# Patient Record
Sex: Male | Born: 1940
Health system: Southern US, Community
[De-identification: ages and names within clinical notes are randomized; demographics above are authoritative.]

## PROBLEM LIST (undated history)

## (undated) DIAGNOSIS — R011 Cardiac murmur, unspecified: Secondary | ICD-10-CM

## (undated) DIAGNOSIS — F458 Other somatoform disorders: Secondary | ICD-10-CM

## (undated) HISTORY — DX: Cardiac murmur, unspecified: R01.1

## (undated) HISTORY — DX: Other somatoform disorders: F45.8

## (undated) HISTORY — PX: CYSTECTOMY: SUR359

## (undated) HISTORY — PX: COLONOSCOPY: SHX174

## (undated) HISTORY — PX: POLYPECTOMY: SHX149

---

## 2011-10-10 ENCOUNTER — Ambulatory Visit (INDEPENDENT_AMBULATORY_CARE_PROVIDER_SITE_OTHER): Payer: Medicare Other | Admitting: Emergency Medicine

## 2011-10-10 VITALS — BP 124/78 | HR 64 | Temp 98.2°F | Resp 20 | Ht 73.0 in | Wt 230.6 lb

## 2011-10-10 DIAGNOSIS — N400 Enlarged prostate without lower urinary tract symptoms: Secondary | ICD-10-CM

## 2011-10-10 MED ORDER — DUTASTERIDE 0.5 MG PO CAPS: 0.5000 mg | ORAL_CAPSULE | Freq: Every day | ORAL | Status: DC

## 2011-10-10 NOTE — Progress Notes (Signed)
  Subjective:    Patient ID: Eric Ferrell, male    DOB: 11-11-1940, 71 y.o.   MRN: [id number]  Urinary Frequency  This is a new problem. The current episode started more than 1 year ago. The problem occurs every urination. The problem has been gradually worsening. The patient is experiencing no pain. There has been no fever. There is no history of pyelonephritis. Associated symptoms include frequency and urgency. Pertinent negatives include no chills, discharge, flank pain, hematuria, hesitancy, nausea, possible pregnancy, sweats or vomiting. He has tried nothing for the symptoms. There is no history of catheterization, kidney stones, recurrent UTIs, a single kidney, urinary stasis or a urological procedure.      Review of Systems  Constitutional: Negative.  Negative for chills.  HENT: Negative.   Eyes: Negative.   Respiratory: Negative.   Cardiovascular: Negative.   Gastrointestinal: Negative.  Negative for nausea and vomiting.  Genitourinary: Positive for urgency, frequency and enuresis. Negative for dysuria, hesitancy, hematuria and flank pain.  Musculoskeletal: Negative.        Objective:   Physical Exam  Constitutional: He is oriented to person, place, and time. He appears well-developed and well-nourished.  HENT:  Head: Normocephalic and atraumatic.  Eyes: Conjunctivae and EOM are normal. Pupils are equal, round, and reactive to light. No scleral icterus.  Neck: Normal range of motion. Neck supple.  Cardiovascular: Normal rate and regular rhythm.   Pulmonary/Chest: Effort normal.  Abdominal: Soft.  Genitourinary: Prostate is enlarged. Prostate is not tender.  Musculoskeletal: Normal range of motion.  Neurological: He is alert and oriented to person, place, and time.  Skin: Skin is warm and dry.          Assessment & Plan:

## 2011-10-10 NOTE — Patient Instructions (Signed)
Benign Prostatic Hyperplasia You have an enlarged prostate. This is common in elderly males. It is called BPH. This stands for benign prostate hyperplasia. The prostate gland is located in base of the bladder. When it grows, the prostate blocks the urethra. This is the tube which drains urine from the bladder.  SYMPTOMS  Weak urine stream.   Dribbling.   Feeling like the bladder has not emptied completely.   Difficulty starting urination.   Getting up frequently at night to urinate.   Urinating more frequently during the day.  Complete urinary blockage or severe pain with urination requires immediate attention. DIAGNOSIS   Your caregiver often has a good idea what is wrong by taking a history and doing a physical exam.   Special x-rays may be done.  TREATMENT   For mild problems, no treatment may be necessary.   If the problems are moderate, medications may provide relief. Some of these work by making the prostate gland smaller. The herb saw palmetto is commonly used.   If complete blockage occurs, a Foley catheter is usually left in place for a few days.   Surgery is often needed for more severe problems. TURP is the prostate surgery for BPH which is done through the urethra. TURP stands for transurethral resection of the prostate. It involves cutting away chips from the prostate. It is done by removing chips so that they can come out through the penis.   Techniques using heat, microwave and laser to remove the prostate blockage are also being used.  HOME CARE INSTRUCTIONS   Give yourself time when you urinate.   Stay away from alcohol.   Beverages containing caffeine such as coffee, tea and colas can make the problems worse.   Decongestants, antihistamines, and some prescription medicines can also make the problem worse.   Follow up with your caregiver for further treatment as recommended.  SEEK IMMEDIATE MEDICAL CARE IF:   You develop increased pain with urination or  are unable to pass your water.   You develop severe abdominal pain, vomiting, a high fever, or fainting.   You develop back pain or blood in your urine.  MAKE SURE YOU:   Understand these instructions.   Will watch your condition.   Will get help right away if you are not doing well or get worse.  Document Released: 03/25/2005 Document Revised: 03/14/2011 Document Reviewed: 11/28/2006 Exit[employer] Patient Information 2012 [employer]. 

## 2012-03-24 ENCOUNTER — Other Ambulatory Visit: Payer: Self-pay | Admitting: Emergency Medicine

## 2013-01-21 DIAGNOSIS — Z0271 Encounter for disability determination: Secondary | ICD-10-CM

## 2013-04-11 ENCOUNTER — Ambulatory Visit (INDEPENDENT_AMBULATORY_CARE_PROVIDER_SITE_OTHER): Payer: 59 | Admitting: Family Medicine

## 2013-04-11 VITALS — BP 140/76 | HR 65 | Temp 98.6°F | Resp 16 | Ht 73.0 in | Wt 220.0 lb

## 2013-04-11 DIAGNOSIS — Z0000 Encounter for general adult medical examination without abnormal findings: Secondary | ICD-10-CM

## 2013-04-11 DIAGNOSIS — R9431 Abnormal electrocardiogram [ECG] [EKG]: Secondary | ICD-10-CM

## 2013-04-11 DIAGNOSIS — R011 Cardiac murmur, unspecified: Secondary | ICD-10-CM

## 2013-04-11 DIAGNOSIS — L259 Unspecified contact dermatitis, unspecified cause: Secondary | ICD-10-CM

## 2013-04-11 DIAGNOSIS — Z23 Encounter for immunization: Secondary | ICD-10-CM

## 2013-04-11 DIAGNOSIS — Z139 Encounter for screening, unspecified: Secondary | ICD-10-CM

## 2013-04-11 DIAGNOSIS — L309 Dermatitis, unspecified: Secondary | ICD-10-CM

## 2013-04-11 LAB — TSH: TSH: 1.157 u[IU]/mL (ref 0.350–4.500)

## 2013-04-11 LAB — COMPREHENSIVE METABOLIC PANEL
ALT: 17 U/L (ref 0–53)
AST: 19 U/L (ref 0–37)
Albumin: 4.5 g/dL (ref 3.5–5.2)
Alkaline Phosphatase: 48 U/L (ref 39–117)
BILIRUBIN TOTAL: 0.5 mg/dL (ref 0.3–1.2)
BUN: 21 mg/dL (ref 6–23)
CO2: 24 mEq/L (ref 19–32)
CREATININE: 1.01 mg/dL (ref 0.50–1.35)
Calcium: 9.7 mg/dL (ref 8.4–10.5)
Chloride: 103 mEq/L (ref 96–112)
Glucose, Bld: 87 mg/dL (ref 70–99)
Potassium: 4.1 mEq/L (ref 3.5–5.3)
SODIUM: 138 meq/L (ref 135–145)
TOTAL PROTEIN: 7 g/dL (ref 6.0–8.3)

## 2013-04-11 LAB — POCT CBC
GRANULOCYTE PERCENT: 42.2 % (ref 37–80)
HEMATOCRIT: 47.7 % (ref 43.5–53.7)
HEMOGLOBIN: 14.9 g/dL (ref 14.1–18.1)
LYMPH, POC: 1.6 (ref 0.6–3.4)
MCH, POC: 28.7 pg (ref 27–31.2)
MCHC: 31.2 g/dL — AB (ref 31.8–35.4)
MCV: 91.9 fL (ref 80–97)
MID (cbc): 0.3 (ref 0–0.9)
MPV: 8.6 fL (ref 0–99.8)
POC GRANULOCYTE: 1.4 — AB (ref 2–6.9)
POC LYMPH %: 49.2 % (ref 10–50)
POC MID %: 8.6 % (ref 0–12)
Platelet Count, POC: 264 10*3/uL (ref 142–424)
RBC: 5.19 M/uL (ref 4.69–6.13)
RDW, POC: 13.3 %
WBC: 3.3 10*3/uL — AB (ref 4.6–10.2)

## 2013-04-11 LAB — LIPID PANEL
CHOL/HDL RATIO: 4 ratio
Cholesterol: 176 mg/dL (ref 0–200)
HDL: 44 mg/dL (ref >39)
LDL CALC: 115 mg/dL — AB (ref 0–99)
TRIGLYCERIDES: 86 mg/dL (ref <150)
VLDL: 17 mg/dL (ref 0–40)

## 2013-04-11 LAB — PSA: PSA: 0.64 ng/mL (ref <=4.00)

## 2013-04-11 LAB — IFOBT (OCCULT BLOOD): IMMUNOLOGICAL FECAL OCCULT BLOOD TEST: NEGATIVE

## 2013-04-11 MED ORDER — TRIAMCINOLONE ACETONIDE 0.1 % EX CREA: 1.0000 "application " | TOPICAL_CREAM | Freq: Two times a day (BID) | CUTANEOUS | Status: DC

## 2013-04-11 MED ORDER — ZOSTER VACCINE LIVE 19400 UNT/0.65ML ~~LOC~~ SOLR: 0.6500 mL | Freq: Once | SUBCUTANEOUS | Status: DC

## 2013-04-11 NOTE — Progress Notes (Addendum)
Subjective:    Patient ID: Eric Ferrell, male    DOB: 1940/05/13, 73 y.o.   MRN: [id number]  HPI This chart was scribed for Corliss Parish, MD by Thea Alken, ED Scribe. This patient was seen in room 10 and the patient's care was started at 8:30 AM.    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 Pt has not had any food or drink prior to being seen.  He reports he has had blood work done about 2 month ago at insurance co.  Pt has H/O possible  psoriasis and states he has patches on his bilateral ankles in which he uses lotion with mild relief. Pt states he has never had colonoscopy. Pt reports he used to receive a prostate screening every year at [facility] but has not had one in a couple of years.  Pt states that his dog might have fleas. Denies family history of cancer.  Pt has history of urinary frequency. Pt urinates 1-2 times per night. Pt takes 2 aspirin a day.  Pt has not had a flu shot this year. He reports he has gotten the flu shot before and developed the flu-like symptoms. Pt denies receiving pneumonia vaccine, shingles vaccine. Pt states he has seen dentist one month ago and. Has not seen eye doctor in 3 years.  PT states he regularly exercises -at least 4 times per week.  He denies CP, light headeness, and dizziness. Pt states his mother has had stroke on right side in her early 45's. He denies early heart disease within family.   Pt states that he was seen a couple years ago by Dr. Carlota Raspberry for arm  injury at the gym.  Pt was last seen July 2013 for possible BPH., but not on meds. No difficulty urinating.  Outside lab June 2013 notable LDL cholesterol 106,  total cholesterol 187  No Known Allergies Prior to Admission medications   Medication Sig Start Date End Date Taking? Authorizing Provider  aspirin 81 MG tablet Take 81 mg by mouth 2 (two) times daily.   Yes Historical Provider,  MD  AVODART 0.5 MG capsule TAKE ONE CAPSULE BY MOUTH DAILY 03/24/12   Rise Mu, PA-C   History   Social History  . Marital Status: Unknown    Spouse Name: N/A    Number of Children: N/A  . Years of Education: N/A   Occupational History  . Not on file.   Social History Main Topics  . Smoking status: Former Smoker -- 5 years    Types: Cigarettes  . Smokeless tobacco: Not on file  . Alcohol Use: Not on file  . Drug Use: Not on file  . Sexual Activity: Not on file   Other Topics Concern  . Not on file   Social History Narrative  . No narrative on file      Review of Systems  Respiratory: Negative for chest tightness.   Neurological: Negative for dizziness and light-headedness.  13 point review of systems per patient health survey noted.  Negative other than as indicated on reviewed nursing note.      Objective:   Physical Exam  Vitals reviewed. Constitutional: He is oriented to person, place, and time. He appears well-developed and well-nourished.  HENT:  Head: Normocephalic and atraumatic.  Right Ear: External ear normal.  Left Ear: External ear normal.  Mouth/Throat: Oropharynx is clear  and moist.  Eyes: Conjunctivae and EOM are normal. Pupils are equal, round, and reactive to light.  Neck: Normal range of motion. Neck supple. No thyromegaly present.  Cardiovascular: Normal rate, regular rhythm and intact distal pulses.   Murmur (faint 2/6 systolic mm. ) heard. Pulmonary/Chest: Effort normal and breath sounds normal. No respiratory distress. He has no wheezes.  Abdominal: Soft. He exhibits no distension. There is no tenderness. Hernia confirmed negative in the right inguinal area and confirmed negative in the left inguinal area.  Genitourinary: Prostate normal.  Musculoskeletal: Normal range of motion. He exhibits no edema and no tenderness.  Lymphadenopathy:    He has no cervical adenopathy.  Neurological: He is alert and oriented to person, place, and time.  He has normal reflexes.  Skin: Skin is warm and dry.  Right ankle: slightly dry scaly patch on right lateral ankle. 6x3 cm without warmth or surrounding erythema  Left ankle: similar lesion , patch 2x3 cm without warmth or surrounding erythema  Psychiatric: He has a normal mood and affect. His behavior is normal.   Filed Vitals:   04/11/13 0818  BP: 140/76  Pulse: 65  Temp: 98.6 F (37 C)  TempSrc: Oral  Resp: 16  Height: 6\' 1"  (1.854 m)  Weight: 220 lb (99.791 kg)  SpO2: 99%   Results for orders placed in visit on 04/11/13  POCT CBC      Result Value Range   WBC 3.3 (*) 4.6 - 10.2 K/uL   Lymph, poc 1.6  0.6 - 3.4   POC LYMPH PERCENT 49.2  10 - 50 %L   MID (cbc) 0.3  0 - 0.9   POC MID % 8.6  0 - 12 %M   POC Granulocyte 1.4 (*) 2 - 6.9   Granulocyte percent 42.2  37 - 80 %G   RBC 5.19  4.69 - 6.13 M/uL   Hemoglobin 14.9  14.1 - 18.1 g/dL   HCT, POC 47.7  43.5 - 53.7 %   MCV 91.9  80 - 97 fL   MCH, POC 28.7  27 - 31.2 pg   MCHC 31.2 (*) 31.8 - 35.4 g/dL   RDW, POC 13.3     Platelet Count, POC 264  142 - 424 K/uL   MPV 8.6  0 - 99.8 fL  IFOBT (OCCULT BLOOD)      Result Value Range   IFOBT Negative       EKG: sr, nonspecific twi inferolaterally, large voltage in v1-V6, possible LVH.      Assessment & Plan:   Eric Ferrell is a 73 y.o. male Annual physical exam - Plan: POCT CBC, IFOBT POC (occult bld, rslt in office), Comprehensive metabolic panel, TSH, PSA, Lipid panel, EKG 12-Lead, Ambulatory referral to Gastroenterology, CANCELED: EKG 12-Lead. Anticipatory guidance, labs drawn. Refused flu vaccine, rx given for zostavax, pneumovax given. Declined sti testing.   Eczema - vs psoriasis -  Plan: triamcinolone cream (KENALOG) 0.1 % BID, eucerin bid and recheck if not improving next 4-6 weeks.   Heart murmur, Nonspecific abnormal electrocardiogram (ECG) (EKG) - Plan: Ambulatory referral to Cardiology for eval. Decrease exertional activities, and ER/911 cp precautions  reviewed.   Need for shingles vaccine - Plan: zoster vaccine live, PF, (ZOSTAVAX) 78588 UNT/0.65ML injection rx printed.   Meds ordered this encounter  Medications  . aspirin 81 MG tablet    Sig: Take 81 mg by mouth 2 (two) times daily.  Marland Kitchen triamcinolone cream (KENALOG) 0.1 %    Sig:  Apply 1 application topically 2 (two) times daily.    Dispense:  30 g    Refill:  0  . zoster vaccine live, PF, (ZOSTAVAX) 25956 UNT/0.65ML injection    Sig: Inject 19,400 Units into the skin once.    Dispense:  1 each    Refill:  0   Patient Instructions  Continue Eucerin twice per day and can use steroid cream up to twice per day if needed for possible eczema versus psoriasis on ankles, but if not improving next 4-6 weeks - recheck.  Shingles vaccine can be given at your pharmacy.  You should receive a call or letter about your lab results within the next week to 10 days.  Your EKG was not normal and will need to see a cardiologist to discuss this. We will refer you, but in the meantime, avoid exertional activities until evaluated by cardiologist. Return to the clinic or go to the nearest emergency room if any of your symptoms worsen or new symptoms occur.     Keeping you healthy  Get these tests  Blood pressure- Have your blood pressure checked once a year by your healthcare provider.  Normal blood pressure is 120/80  Weight- Have your body mass index (BMI) calculated to screen for obesity.  BMI is a measure of body fat based on height and weight. You can also calculate your own BMI at [email or web address].  Cholesterol- Have your cholesterol checked every year.  Diabetes- Have your blood sugar checked regularly if you have high blood pressure, high cholesterol, have a family history of diabetes or if you are overweight.  Screening for Colon Cancer- Colonoscopy starting at age 16.  Screening may begin sooner depending on your family history and other health conditions. Follow up colonoscopy  as directed by your Gastroenterologist.  Screening for Prostate Cancer- Both blood work (PSA) and a rectal exam help screen for Prostate Cancer.  Screening begins at age 24 with African-American men and at age 10 with Caucasian men.  Screening may begin sooner depending on your family history.  Take these medicines  Aspirin- One aspirin daily can help prevent Heart disease and Stroke.  Flu shot- Every fall.  Tetanus- Every 10 years.  Zostavax- Once after the age of 104 to prevent Shingles.  Pneumonia shot- Once after the age of 41; if you are younger than 16, ask your healthcare provider if you need a Pneumonia shot.  Take these steps  Don't smoke- If you do smoke, talk to your doctor about quitting.  For tips on how to quit, go to www.smokefree.gov or call 1-800-QUIT-NOW.  Be physically active- Exercise 5 days a week for at least 30 minutes.  If you are not already physically active start slow and gradually work up to 30 minutes of moderate physical activity.  Examples of moderate activity include walking briskly, mowing the yard, dancing, swimming, bicycling, etc.  Eat a healthy diet- Eat a variety of healthy food such as fruits, vegetables, low fat milk, low fat cheese, yogurt, lean meant, poultry, fish, beans, tofu, etc. For more information go to www.thenutritionsource.org  Drink alcohol in moderation- Limit alcohol intake to less than two drinks a day. Never drink and drive.  Dentist- Brush and floss twice daily; visit your dentist twice a year.  Depression- Your emotional health is as important as your physical health. If you're feeling down, or losing interest in things you would normally enjoy please talk to your healthcare provider.  Eye exam- Visit your eye doctor every  year.  Safe sex- If you may be exposed to a sexually transmitted infection, use a condom.  Seat belts- Seat belts can save your life; always wear one.  Smoke/Carbon Monoxide detectors- These detectors  need to be installed on the appropriate level of your home.  Replace batteries at least once a year.  Skin cancer- When out in the sun, cover up and use sunscreen 15 SPF or higher.  Violence- If anyone is threatening you, please tell your healthcare provider.  Living Will/ Health care power of attorney- Speak with your healthcare provider and family.    I personally performed the services described in this documentation, which was scribed in my presence. The recorded information has been reviewed and considered, and addended by me as needed.

## 2013-04-11 NOTE — Patient Instructions (Addendum)
Continue Eucerin twice per day and can use steroid cream up to twice per day if needed for possible eczema versus psoriasis on ankles, but if not improving next 4-6 weeks - recheck.  Shingles vaccine can be given at your pharmacy.  You should receive a call or letter about your lab results within the next week to 10 days.  Your EKG was not normal and will need to see a cardiologist to discuss this. We will refer you, but in the meantime, avoid exertional activities until evaluated by cardiologist. Return to the clinic or go to the nearest emergency room if any of your symptoms worsen or new symptoms occur.     Keeping you healthy  Get these tests  Blood pressure- Have your blood pressure checked once a year by your healthcare provider.  Normal blood pressure is 120/80  Weight- Have your body mass index (BMI) calculated to screen for obesity.  BMI is a measure of body fat based on height and weight. You can also calculate your own BMI at [email or web address].  Cholesterol- Have your cholesterol checked every year.  Diabetes- Have your blood sugar checked regularly if you have high blood pressure, high cholesterol, have a family history of diabetes or if you are overweight.  Screening for Colon Cancer- Colonoscopy starting at age 91.  Screening may begin sooner depending on your family history and other health conditions. Follow up colonoscopy as directed by your Gastroenterologist.  Screening for Prostate Cancer- Both blood work (PSA) and a rectal exam help screen for Prostate Cancer.  Screening begins at age 91 with African-American men and at age 2 with Caucasian men.  Screening may begin sooner depending on your family history.  Take these medicines  Aspirin- One aspirin daily can help prevent Heart disease and Stroke.  Flu shot- Every fall.  Tetanus- Every 10 years.  Zostavax- Once after the age of 54 to prevent Shingles.  Pneumonia shot- Once after the age of 48; if you are  younger than 58, ask your healthcare provider if you need a Pneumonia shot.  Take these steps  Don't smoke- If you do smoke, talk to your doctor about quitting.  For tips on how to quit, go to www.smokefree.gov or call 1-800-QUIT-NOW.  Be physically active- Exercise 5 days a week for at least 30 minutes.  If you are not already physically active start slow and gradually work up to 30 minutes of moderate physical activity.  Examples of moderate activity include walking briskly, mowing the yard, dancing, swimming, bicycling, etc.  Eat a healthy diet- Eat a variety of healthy food such as fruits, vegetables, low fat milk, low fat cheese, yogurt, lean meant, poultry, fish, beans, tofu, etc. For more information go to www.thenutritionsource.org  Drink alcohol in moderation- Limit alcohol intake to less than two drinks a day. Never drink and drive.  Dentist- Brush and floss twice daily; visit your dentist twice a year.  Depression- Your emotional health is as important as your physical health. If you're feeling down, or losing interest in things you would normally enjoy please talk to your healthcare provider.  Eye exam- Visit your eye doctor every year.  Safe sex- If you may be exposed to a sexually transmitted infection, use a condom.  Seat belts- Seat belts can save your life; always wear one.  Smoke/Carbon Monoxide detectors- These detectors need to be installed on the appropriate level of your home.  Replace batteries at least once a year.  Skin cancer- When out in  the sun, cover up and use sunscreen 15 SPF or higher.  Violence- If anyone is threatening you, please tell your healthcare provider.  Living Will/ Health care power of attorney- Speak with your healthcare provider and family.

## 2013-04-12 ENCOUNTER — Encounter: Payer: Medicare Other | Admitting: Family Medicine

## 2013-04-15 ENCOUNTER — Encounter: Payer: Self-pay | Admitting: Family Medicine

## 2013-04-27 ENCOUNTER — Institutional Professional Consult (permissible substitution): Payer: Self-pay | Admitting: Interventional Cardiology

## 2013-07-05 ENCOUNTER — Encounter: Payer: Self-pay | Admitting: Family Medicine

## 2013-11-29 ENCOUNTER — Ambulatory Visit (INDEPENDENT_AMBULATORY_CARE_PROVIDER_SITE_OTHER): Payer: Medicare Other | Admitting: Family Medicine

## 2013-11-29 VITALS — BP 136/74 | HR 61 | Temp 98.8°F | Resp 16 | Ht 73.0 in | Wt 232.6 lb

## 2013-11-29 DIAGNOSIS — T169XXA Foreign body in ear, unspecified ear, initial encounter: Secondary | ICD-10-CM

## 2013-11-29 DIAGNOSIS — T161XXA Foreign body in right ear, initial encounter: Secondary | ICD-10-CM

## 2013-11-29 DIAGNOSIS — IMO0002 Reserved for concepts with insufficient information to code with codable children: Secondary | ICD-10-CM

## 2013-11-29 NOTE — Progress Notes (Signed)
73 year old retired man who for 2 weeks is felt something crawling around his right year. He's tried shaking his head and using Q-tips but nothing has worked. He's having no hearing loss or dizziness or tinnitus  Objective: Regular canals clear and the TM looks normal.  1 cc of Xylocaine 1% plain was instilled in the or in case there was a cockroach or some other insect. He was allowed to lie on his side for 15 minutes.  Noted at conclusion of visit   Assessment: Possible foreign body in the right ear with complete resolution by the time discharge.  Signed, Robyn Haber

## 2014-04-04 ENCOUNTER — Encounter: Payer: Self-pay | Admitting: Internal Medicine

## 2014-05-11 ENCOUNTER — Encounter: Payer: Self-pay | Admitting: Family Medicine

## 2014-05-12 NOTE — Telephone Encounter (Signed)
Noted he has colonoscopy scheduled.  Did he want to return to our office for tetanus? We can do that without seeing a provider, but not covered by medicare for preventative purposes, so may be out of pocket cost. If he had this done at time of injury, would be covered. Can discuss this further at physical if needed. Can we also help him schedule physical?  Please clarify with him and help correct what parts of his chart are incorrect. Let me know if I need to complete something. -JG

## 2014-05-18 ENCOUNTER — Ambulatory Visit (AMBULATORY_SURGERY_CENTER): Payer: Self-pay

## 2014-05-18 VITALS — Ht 73.0 in | Wt 215.0 lb

## 2014-05-18 DIAGNOSIS — Z1211 Encounter for screening for malignant neoplasm of colon: Secondary | ICD-10-CM

## 2014-05-18 MED ORDER — MOVIPREP 100 G PO SOLR: 1.0000 | Freq: Once | ORAL | Status: DC

## 2014-05-18 NOTE — Progress Notes (Signed)
No allergies to eggs or soy No diet/weight loss meds No home oxygen No past problems with anesthesia  Has email  Emmi instructions given for colonoscopy 

## 2014-05-29 ENCOUNTER — Emergency Department (HOSPITAL_COMMUNITY)
Admission: EM | Admit: 2014-05-29 | Discharge: 2014-05-29 | Disposition: A | Discharge status: Home / Self Care | Payer: Medicare Other | Attending: Emergency Medicine | Admitting: Emergency Medicine

## 2014-05-29 ENCOUNTER — Encounter (HOSPITAL_COMMUNITY): Payer: Self-pay | Admitting: Physical Medicine and Rehabilitation

## 2014-05-29 DIAGNOSIS — R42 Dizziness and giddiness: Secondary | ICD-10-CM | POA: Diagnosis not present

## 2014-05-29 DIAGNOSIS — R55 Syncope and collapse: Secondary | ICD-10-CM | POA: Insufficient documentation

## 2014-05-29 DIAGNOSIS — Z87891 Personal history of nicotine dependence: Secondary | ICD-10-CM | POA: Diagnosis not present

## 2014-05-29 DIAGNOSIS — Z7982 Long term (current) use of aspirin: Secondary | ICD-10-CM | POA: Insufficient documentation

## 2014-05-29 LAB — URINALYSIS, ROUTINE W REFLEX MICROSCOPIC
BILIRUBIN URINE: NEGATIVE
Glucose, UA: NEGATIVE mg/dL
Hgb urine dipstick: NEGATIVE
Ketones, ur: NEGATIVE mg/dL
Leukocytes, UA: NEGATIVE
NITRITE: NEGATIVE
PH: 5.5 (ref 5.0–8.0)
Protein, ur: NEGATIVE mg/dL
SPECIFIC GRAVITY, URINE: 1.02 (ref 1.005–1.030)
Urobilinogen, UA: 0.2 mg/dL (ref 0.0–1.0)

## 2014-05-29 LAB — COMPREHENSIVE METABOLIC PANEL
ALT: 19 U/L (ref 0–53)
ANION GAP: 4 — AB (ref 5–15)
AST: 21 U/L (ref 0–37)
Albumin: 3.9 g/dL (ref 3.5–5.2)
Alkaline Phosphatase: 46 U/L (ref 39–117)
BUN: 13 mg/dL (ref 6–23)
CO2: 30 mmol/L (ref 19–32)
Calcium: 9.2 mg/dL (ref 8.4–10.5)
Chloride: 105 mmol/L (ref 96–112)
Creatinine, Ser: 1.1 mg/dL (ref 0.50–1.35)
GFR, EST AFRICAN AMERICAN: 75 mL/min — AB (ref >90)
GFR, EST NON AFRICAN AMERICAN: 65 mL/min — AB (ref >90)
Glucose, Bld: 123 mg/dL — ABNORMAL HIGH (ref 70–99)
Potassium: 4.2 mmol/L (ref 3.5–5.1)
Sodium: 139 mmol/L (ref 135–145)
Total Bilirubin: 0.4 mg/dL (ref 0.3–1.2)
Total Protein: 6.6 g/dL (ref 6.0–8.3)

## 2014-05-29 LAB — CBC
HEMATOCRIT: 43.2 % (ref 39.0–52.0)
Hemoglobin: 14.3 g/dL (ref 13.0–17.0)
MCH: 28.6 pg (ref 26.0–34.0)
MCHC: 33.1 g/dL (ref 30.0–36.0)
MCV: 86.4 fL (ref 78.0–100.0)
Platelets: 211 10*3/uL (ref 150–400)
RBC: 5 MIL/uL (ref 4.22–5.81)
RDW: 12.5 % (ref 11.5–15.5)
WBC: 4.2 10*3/uL (ref 4.0–10.5)

## 2014-05-29 LAB — TROPONIN I: Troponin I: <0.03 ng/mL (ref <0.031)

## 2014-05-29 NOTE — ED Provider Notes (Signed)
CSN: [id number]     Arrival date & time 05/29/14  1315 History   First MD Initiated Contact with Patient 05/29/14 1327     Chief Complaint  Patient presents with  . Loss of Consciousness     (Consider location/radiation/quality/duration/timing/severity/associated sxs/prior Treatment) Patient is a 74 y.o. male presenting with syncope. The history is provided by the patient.  Loss of Consciousness Associated symptoms: no chest pain, no confusion, no fever, no headaches, no shortness of breath, no vomiting and no weakness   pt w near syncopal vs very brief syncopal episode at church today, just pta. Pt states had been standing up for awhile, began to feel faint, lightheaded, then warm/hot, flushed, tried to remain standing, but then fainting/fell to ground. Family indicates he went very slowly down to floor. Pt denies injury or pain. No headache. No neck or back pain. Pt denies any current or preceding palpitations or sense of either fast or irregular heartbeat. No chest pain or discomfort. No sob. No abd pain. No nvd. No recent blood loss, rectal bleeding or melena. No dysuria or gu c/o. No cough or uri c/o. No fever or chills. Pt does indicate he started the prep for a routine colonoscopy 2 days ago (colonoscopy is scheduled for this Wednesday).  He indicates he is eating clear liquids, white rice, and cream of wheat, but no regular food.       History reviewed. No pertinent past medical history. Past Surgical History  Procedure Laterality Date  . Cystectomy      left leg   Family History  Problem Relation Age of Onset  . Healthy Mother   . Healthy Father   . Diabetes Sister   . Healthy Brother   . Healthy Maternal Grandmother   . Healthy Maternal Grandfather   . Healthy Paternal Grandmother   . Healthy Paternal Grandfather   . Healthy Brother   . Healthy Sister   . Colon cancer Neg Hx    History  Substance Use Topics  . Smoking status: Former Smoker -- 5 years    Types:  Cigarettes  . Smokeless tobacco: Never Used  . Alcohol Use: No    Review of Systems  Constitutional: Negative for fever and chills.  HENT: Negative for sore throat.   Eyes: Negative for visual disturbance.  Respiratory: Negative for cough and shortness of breath.   Cardiovascular: Positive for syncope. Negative for chest pain.  Gastrointestinal: Negative for vomiting, abdominal pain, diarrhea and blood in stool.  Endocrine: Negative for polyuria.  Genitourinary: Negative for dysuria and flank pain.  Musculoskeletal: Negative for back pain and neck pain.  Skin: Negative for rash.  Neurological: Negative for weakness, numbness and headaches.       No hx syncope  Hematological: Does not bruise/bleed easily.  Psychiatric/Behavioral: Negative for confusion.      Allergies  Review of patient's allergies indicates no known allergies.  Home Medications   Prior to Admission medications   Medication Sig Start Date End Date Taking? Authorizing Provider  aspirin 81 MG tablet Take 81 mg by mouth 2 (two) times daily.    Historical Provider, MD  MOVIPREP 100 G SOLR Take 1 kit (200 g total) by mouth once. 05/18/14   Jerene Bears, MD  zoster vaccine live, PF, (ZOSTAVAX) 93716 UNT/0.65ML injection Inject 19,400 Units into the skin once. 04/11/13   Wendie Agreste, MD   BP 123/69 mmHg  Pulse 68  Temp(Src) 97.9 F (36.6 C) (Oral)  Resp 20  Ht 6' 1"  (1.854 m)  Wt 215 lb (97.523 kg)  BMI 28.37 kg/m2  SpO2 100% Physical Exam  Constitutional: He is oriented to person, place, and time. He appears well-developed and well-nourished. No distress.  HENT:  Mouth/Throat: Oropharynx is clear and moist.  Eyes: Conjunctivae are normal. Pupils are equal, round, and reactive to light. No scleral icterus.  Neck: Neck supple. No tracheal deviation present.  No bruit  Cardiovascular: Normal rate, regular rhythm, normal heart sounds and intact distal pulses.  Exam reveals no gallop and no friction rub.    No murmur heard. Pulmonary/Chest: Effort normal and breath sounds normal. No accessory muscle usage. No respiratory distress.  Abdominal: Soft. Bowel sounds are normal. He exhibits no distension and no mass. There is no tenderness. There is no rebound and no guarding.  No puls mass  Genitourinary:  No cva tenderness  Musculoskeletal: Normal range of motion. He exhibits no edema or tenderness.  CTLS spine, non tender, aligned, no step off. No extremity pain or injury.   Neurological: He is alert and oriented to person, place, and time.  Motor intact bil, stre 5/5. sens grossly intact. Ambulates w steady gait.   Skin: Skin is warm and dry. No rash noted. He is not diaphoretic.  Psychiatric: He has a normal mood and affect.  Nursing note and vitals reviewed.   ED Course  Procedures (including critical care time) Labs Review  Results for orders placed or performed during the hospital encounter of 05/29/14  CBC  Result Value Ref Range   WBC 4.2 4.0 - 10.5 K/uL   RBC 5.00 4.22 - 5.81 MIL/uL   Hemoglobin 14.3 13.0 - 17.0 g/dL   HCT 43.2 39.0 - 52.0 %   MCV 86.4 78.0 - 100.0 fL   MCH 28.6 26.0 - 34.0 pg   MCHC 33.1 30.0 - 36.0 g/dL   RDW 12.5 11.5 - 15.5 %   Platelets 211 150 - 400 K/uL       EKG Interpretation   Date/Time:  Sunday May 29 2014 13:23:13 EST Ventricular Rate:  66 PR Interval:  195 QRS Duration: 90 QT Interval:  418 QTC Calculation: 438 R Axis:   53 Text Interpretation:  Sinus rhythm Right atrial enlargement Probable LVH  with secondary repol abnrm Non-specific ST-t changes No previous tracing  Confirmed by Ashok Cordia  MD, Lennette Bihari ([id number]) on 05/29/2014 1:37:52 PM      MDM   Iv ns. Labs.   Monitor.  ecg noted. No old ecg to compare.  Pt denies any current or recent cp or discomfort of any sort. No unusual doe or fatigue. No palpitations.  Pt does note recent very minimal po intake, due to bowel prep for colonoscopy this week.  Po fluids.  Reviewed  nursing notes and prior charts for additional history.   1413 labs pending - signed out to Dr Winfred Leeds to check labs, recheck pt.    If labs unremarkable and symptoms remain resolved, feel likely pt will be stable for d/c.      Mirna Mires, MD 05/29/14 1414

## 2014-05-29 NOTE — ED Notes (Signed)
Pt reports that he had a syncopal episode while at church and sts "It's because I'm prepping for a colonoscopy on Wednesday.  Everything I normally eat I can't eat until after Wednesday so I just got hot and haven't been eating enough and passed out.  I'm fine."

## 2014-05-29 NOTE — Discharge Instructions (Signed)
It was our pleasure to provide your ER care today - we hope that you feel better.  If you begin to feel faint/dizzy, as if you may pass out, sit or lay down immediately to avoid fainting and/or injury.  Make sure you are getting adequate fluids, calories, and nutrition, even amidst your bowel prep.  Rest. Drink plenty of fluids.  Follow up with primary care doctor this week.  Return to ER if worse, new symptoms, fevers, chest pain, rapid heart beat, faint, other concern.   Call [facility] tomorrow to let him know about today's event and your visit here. He may want to change your bowel prep Syncope Syncope is a medical term for fainting or passing out. This means you lose consciousness and drop to the ground. People are generally unconscious for less than 5 minutes. You may have some muscle twitches for up to 15 seconds before waking up and returning to normal. Syncope occurs more often in older adults, but it can happen to anyone. While most causes of syncope are not dangerous, syncope can be a sign of a serious medical problem. It is important to seek medical care.  CAUSES  Syncope is caused by a sudden drop in blood flow to the brain. The specific cause is often not determined. Factors that can bring on syncope include:  Taking medicines that lower blood pressure.  Sudden changes in posture, such as standing up quickly.  Taking more medicine than prescribed.  Standing in one place for too long.  Seizure disorders.  Dehydration and excessive exposure to heat.  Low blood sugar (hypoglycemia).  Straining to have a bowel movement.  Heart disease, irregular heartbeat, or other circulatory problems.  Fear, emotional distress, seeing blood, or severe pain. SYMPTOMS  Right before fainting, you may:  Feel dizzy or light-headed.  Feel nauseous.  See all white or all black in your field of vision.  Have cold, clammy skin. DIAGNOSIS  Your health care provider will ask about  your symptoms, perform a physical exam, and perform an electrocardiogram (ECG) to record the electrical activity of your heart. Your health care provider may also perform other heart or blood tests to determine the cause of your syncope which may include:  Transthoracic echocardiogram (TTE). During echocardiography, sound waves are used to evaluate how blood flows through your heart.  Transesophageal echocardiogram (TEE).  Cardiac monitoring. This allows your health care provider to monitor your heart rate and rhythm in real time.  Holter monitor. This is a portable device that records your heartbeat and can help diagnose heart arrhythmias. It allows your health care provider to track your heart activity for several days, if needed.  Stress tests by exercise or by giving medicine that makes the heart beat faster. TREATMENT  In most cases, no treatment is needed. Depending on the cause of your syncope, your health care provider may recommend changing or stopping some of your medicines. HOME CARE INSTRUCTIONS  Have someone stay with you until you feel stable.  Do not drive, use machinery, or play sports until your health care provider says it is okay.  Keep all follow-up appointments as directed by your health care provider.  Lie down right away if you start feeling like you might faint. Breathe deeply and steadily. Wait until all the symptoms have passed.  Drink enough fluids to keep your urine clear or pale yellow.  If you are taking blood pressure or heart medicine, get up slowly and take several minutes to sit and  then stand. This can reduce dizziness. SEEK IMMEDIATE MEDICAL CARE IF:   You have a severe headache.  You have unusual pain in the chest, abdomen, or back.  You are bleeding from your mouth or rectum, or you have black or tarry stool.  You have an irregular or very fast heartbeat.  You have pain with breathing.  You have repeated fainting or seizure-like jerking  during an episode.  You faint when sitting or lying down.  You have confusion.  You have trouble walking.  You have severe weakness.  You have vision problems. If you fainted, call your local emergency services (911 in [place] Do not drive yourself to the hospital.  MAKE SURE YOU:  Understand these instructions.  Will watch your condition.  Will get help right away if you are not doing well or get worse. Document Released: 03/25/2005 Document Revised: 03/30/2013 Document Reviewed: 05/24/2011 [employer] Patient Information 2015 [place], [place]. This information is not intended to replace advice given to you by your health care provider. Make sure you discuss any questions you have with your health care provider.

## 2014-05-29 NOTE — ED Notes (Signed)
Pt presents to department via [facility] for evaluation of syncope. Was at church when he became dizzy and diaphoretic, then passed out. CBG 107. Pt is alert and oriented x4 upon arrival. Denies pain. 20g L hand.

## 2014-05-29 NOTE — ED Provider Notes (Signed)
Shouldn't had syncopal event today while seated at church. States church was extremely hot. He had brief syncopal event. He is presently asymptomatic and feels well. Patient has been undergoing bowel prep since 05/27/2014 for colonoscopy which is scheduled to occur in 3 days. On exam alert no distress lungs clear auscultation heart regular rate and rhythm no murmurs. He is not lightheaded on standing. Gait is normal. He appears well and feels ready to go home. Patient is instructed to call Dr.Ptyrtle tomorrow to let him know about today's event, in preparation for colonoscopy scheduled for 3 days from a Syncope likely vasovagal in etiology  Orlie Dakin, MD 05/29/14 1712

## 2014-05-30 ENCOUNTER — Telehealth: Payer: Self-pay | Admitting: Internal Medicine

## 2014-05-30 NOTE — Telephone Encounter (Signed)
Pt had syncopal episode in church yesterday.  He was told by the ER doctor to let you know since his procedure is Wednesday.   He told me that his diet basically consists of what we told him not to eat starting 5 days before.  He has only been eating cream of wheat and rice.   Spoke with pt and he states, "I am feeling great and have been to the gym today."  He had no further episodes.  I told him to eat a regular diet (besides the no nuts, salads, etc..that we told him at his PV) today and to push fluids t/o his prep.    Dr. Hilarie Fredrickson, Do you have any further recommendations?  Cyril Mourning

## 2014-05-30 NOTE — Telephone Encounter (Signed)
Noted  

## 2014-05-30 NOTE — Telephone Encounter (Signed)
Focus on hydration and call if any other problems At this point felt okay for colonoscopy on Wednesday

## 2014-06-01 ENCOUNTER — Encounter: Payer: Self-pay | Admitting: Internal Medicine

## 2014-06-01 ENCOUNTER — Ambulatory Visit (AMBULATORY_SURGERY_CENTER): Payer: Medicare Other | Admitting: Internal Medicine

## 2014-06-01 VITALS — BP 118/50 | HR 57 | Temp 96.7°F | Resp 27 | Ht 73.0 in | Wt 215.0 lb

## 2014-06-01 DIAGNOSIS — D122 Benign neoplasm of ascending colon: Secondary | ICD-10-CM

## 2014-06-01 DIAGNOSIS — D125 Benign neoplasm of sigmoid colon: Secondary | ICD-10-CM | POA: Diagnosis not present

## 2014-06-01 DIAGNOSIS — D123 Benign neoplasm of transverse colon: Secondary | ICD-10-CM

## 2014-06-01 DIAGNOSIS — D124 Benign neoplasm of descending colon: Secondary | ICD-10-CM

## 2014-06-01 DIAGNOSIS — Z1211 Encounter for screening for malignant neoplasm of colon: Secondary | ICD-10-CM

## 2014-06-01 MED ORDER — SODIUM CHLORIDE 0.9 % IV SOLN: 500.0000 mL | INTRAVENOUS | Status: DC

## 2014-06-01 NOTE — Progress Notes (Signed)
Called to room to assist during endoscopic procedure.  Patient ID and intended procedure confirmed with present staff. Received instructions for my participation in the procedure from the performing physician.  

## 2014-06-01 NOTE — Progress Notes (Signed)
A/ox3 pleased with MAC, report to Wendy RN 

## 2014-06-01 NOTE — Patient Instructions (Signed)
YOU HAD AN ENDOSCOPIC PROCEDURE TODAY AT THE [facility]: Refer to the procedure report that was given to you for any specific questions about what was found during the examination.  If the procedure report does not answer your questions, please call your gastroenterologist to clarify.  If you requested that your care partner not be given the details of your procedure findings, then the procedure report has been included in a sealed envelope for you to review at your convenience later.  YOU SHOULD EXPECT: Some feelings of bloating in the abdomen. Passage of more gas than usual.  Walking can help get rid of the air that was put into your GI tract during the procedure and reduce the bloating. If you had a lower endoscopy (such as a colonoscopy or flexible sigmoidoscopy) you may notice spotting of blood in your stool or on the toilet paper. If you underwent a bowel prep for your procedure, then you may not have a normal bowel movement for a few days.  DIET: Your first meal following the procedure should be a light meal and then it is ok to progress to your normal diet.  A half-sandwich or bowl of soup is an example of a good first meal.  Heavy or fried foods are harder to digest and may make you feel nauseous or bloated.  Likewise meals heavy in dairy and vegetables can cause extra gas to form and this can also increase the bloating.  Drink plenty of fluids but you should avoid alcoholic beverages for 24 hours.  ACTIVITY: Your care partner should take you home directly after the procedure.  You should plan to take it easy, moving slowly for the rest of the day.  You can resume normal activity the day after the procedure however you should NOT DRIVE or use heavy machinery for 24 hours (because of the sedation medicines used during the test).    SYMPTOMS TO REPORT IMMEDIATELY: A gastroenterologist can be reached at any hour.  During normal business hours, 8:30 AM to 5:00 PM Monday through Friday,  call [phone].  After hours and on weekends, please call the GI answering service at [phone] who will take a message and have the physician on call contact you.   Following lower endoscopy (colonoscopy or flexible sigmoidoscopy):  Excessive amounts of blood in the stool  Significant tenderness or worsening of abdominal pains  Swelling of the abdomen that is new, acute  Fever of 100F or higher  FOLLOW UP: If any biopsies were taken you will be contacted by phone or by letter within the next 1-3 weeks.  Call your gastroenterologist if you have not heard about the biopsies in 3 weeks.  Our staff will call the home number listed on your records the next business day following your procedure to check on you and address any questions or concerns that you may have at that time regarding the information given to you following your procedure. This is a courtesy call and so if there is no answer at the home number and we have not heard from you through the emergency physician on call, we will assume that you have returned to your regular daily activities without incident.  SIGNATURES/CONFIDENTIALITY: You and/or your care partner have signed paperwork which will be entered into your electronic medical record.  These signatures attest to the fact that that the information above on your After Visit Summary has been reviewed and is understood.  Full responsibility of the confidentiality of this   discharge information lies with you and/or your care-partner.  Recommendations Next colonoscopy determined by pathology results. Discharge instructions given to patient and/or care partner. Polyp handout provided.

## 2014-06-01 NOTE — Op Note (Signed)
[place]  [address] [place], [id number]   COLONOSCOPY PROCEDURE REPORT  PATIENT: Eric, Ferrell  MR#: [id number] BIRTHDATE: [birth year] , 73  yrs. old GENDER: male ENDOSCOPIST: Jerene Bears, MD REFERRED BY: PROCEDURE DATE:  06/01/2014 PROCEDURE:   Colonoscopy with snare polypectomy and Colonoscopy with cold biopsy polypectomy First Screening Colonoscopy - Avg.  risk and is 50 yrs.  old or older Yes.  Prior Negative Screening - Now for repeat screening. N/A  History of Adenoma - Now for follow-up colonoscopy & has been > or = to 3 yrs.  N/A  Polyps Removed Today? Yes. ASA CLASS:   Class II INDICATIONS:average risk patient for colon cancer and 1st colonoscopy. MEDICATIONS: Monitored anesthesia care and Propofol 240 mg IV  DESCRIPTION OF PROCEDURE:   After the risks benefits and alternatives of the procedure were thoroughly explained, informed consent was obtained.  The digital rectal exam revealed no rectal mass.   The LB [id number]  endoscope was introduced through the anus and advanced to the cecum, which was identified by both the appendix and ileocecal valve. No adverse events experienced. The quality of the prep was good, using MoviPrep  The instrument was then slowly withdrawn as the colon was fully examined.   COLON FINDINGS: Eight sessile polyps ranging from 3 to 74mm in size were found in the transverse colon (4), ascending colon (2), sigmoid colon (1), and descending colon (1).  Polypectomies were performed with a cold snare (5) and with cold forceps (3).  The resection was complete, the polyp tissue was completely retrieved and sent to histology.   The examination was otherwise normal. Retroflexed views revealed no abnormalities. The time to cecum=3 minutes 45 seconds.  Withdrawal time=21 minutes 59 seconds.  The scope was withdrawn and the procedure completed. COMPLICATIONS: There were no immediate complications.  ENDOSCOPIC  IMPRESSION: 1.   Eight sessile polyps ranging from 3 to 35mm in size were found in the transverse colon, ascending colon, sigmoid colon, and descending colon; polypectomies were performed with a cold snare and with cold forceps 2.   The examination was otherwise normal  RECOMMENDATIONS: 1.  Await pathology results 2.  Timing of repeat colonoscopy will be determined by pathology findings. 3.  You will receive a letter within 1-2 weeks with the results of your biopsy as well as final recommendations.  Please call my office if you have not received a letter after 3 weeks.  eSigned:  Jerene Bears, MD 06/01/2014 12:10 PM   cc: [name], MD and The Patient

## 2014-06-02 ENCOUNTER — Telehealth: Payer: Self-pay | Admitting: *Deleted

## 2014-06-02 NOTE — Telephone Encounter (Signed)
  Follow up Call-  Call back number 06/01/2014  Post procedure Call Back phone  # [phone] hm  Permission to leave phone message Yes     Patient questions:  Do you have a fever, pain , or abdominal swelling? No. Pain Score  0 *  Have you tolerated food without any problems? Yes.    Have you been able to return to your normal activities? Yes.    Do you have any questions about your discharge instructions: Diet   No. Medications  No. Follow up visit  No.  Do you have questions or concerns about your Care? No.  Actions: * If pain score is 4 or above: No action needed, pain <4.

## 2014-06-08 ENCOUNTER — Encounter: Payer: Self-pay | Admitting: Internal Medicine

## 2014-08-15 ENCOUNTER — Encounter: Payer: Self-pay | Admitting: Family Medicine

## 2014-08-15 ENCOUNTER — Encounter (INDEPENDENT_AMBULATORY_CARE_PROVIDER_SITE_OTHER): Payer: Medicare Other | Admitting: Family Medicine

## 2014-08-17 NOTE — Progress Notes (Signed)
This encounter was created in error - please disregard.

## 2014-09-23 ENCOUNTER — Encounter: Payer: Self-pay | Admitting: *Deleted

## 2014-10-03 ENCOUNTER — Other Ambulatory Visit: Payer: Self-pay

## 2014-10-03 ENCOUNTER — Telehealth: Payer: Self-pay | Admitting: *Deleted

## 2014-10-03 NOTE — Telephone Encounter (Signed)
Patient phoned in response to my letter to schedule his AWV.  Patient instructed on NPO, etc...  He will probably come in first thing in am to have labs drawn & come back in afternoon..   Patient verbalized understanding and appreciation.

## 2014-11-29 ENCOUNTER — Telehealth: Payer: Self-pay | Admitting: *Deleted

## 2014-11-29 NOTE — Telephone Encounter (Signed)
Phoned patient to reschedule 9/9 OV, no answer, [name] on voicemail.

## 2014-12-01 NOTE — Telephone Encounter (Signed)
Patient returned my call, LMOM, returned his call, and he had already turned phone off (went straight to voicemail, he works nights).  LMTCB

## 2014-12-01 NOTE — Telephone Encounter (Signed)
Pt returned my call and we were finally able to reschedule his CPE to 12/5.  Pt verbalized understanding of fasting for labs and appreciation.

## 2014-12-26 ENCOUNTER — Ambulatory Visit: Payer: Self-pay | Admitting: Family Medicine

## 2015-03-13 ENCOUNTER — Encounter: Payer: Self-pay | Admitting: Family Medicine

## 2015-03-13 ENCOUNTER — Ambulatory Visit (INDEPENDENT_AMBULATORY_CARE_PROVIDER_SITE_OTHER): Payer: Medicare Other | Admitting: Family Medicine

## 2015-03-13 VITALS — BP 120/64 | HR 64 | Temp 98.6°F | Resp 16 | Ht 72.0 in | Wt 193.8 lb

## 2015-03-13 DIAGNOSIS — Z0000 Encounter for general adult medical examination without abnormal findings: Secondary | ICD-10-CM

## 2015-03-13 DIAGNOSIS — Z131 Encounter for screening for diabetes mellitus: Secondary | ICD-10-CM

## 2015-03-13 DIAGNOSIS — Z136 Encounter for screening for cardiovascular disorders: Secondary | ICD-10-CM | POA: Diagnosis not present

## 2015-03-13 DIAGNOSIS — Z13220 Encounter for screening for lipoid disorders: Secondary | ICD-10-CM

## 2015-03-13 DIAGNOSIS — Z23 Encounter for immunization: Secondary | ICD-10-CM | POA: Diagnosis not present

## 2015-03-13 DIAGNOSIS — Z125 Encounter for screening for malignant neoplasm of prostate: Secondary | ICD-10-CM

## 2015-03-13 DIAGNOSIS — Z130 Encounter for screening for diseases of the blood and blood-forming organs and certain disorders involving the immune mechanism: Secondary | ICD-10-CM | POA: Diagnosis not present

## 2015-03-13 LAB — COMPLETE METABOLIC PANEL WITH GFR
ALT: 18 U/L (ref 9–46)
AST: 21 U/L (ref 10–35)
Albumin: 4.5 g/dL (ref 3.6–5.1)
Alkaline Phosphatase: 59 U/L (ref 40–115)
BUN: 19 mg/dL (ref 7–25)
CALCIUM: 9.8 mg/dL (ref 8.6–10.3)
CO2: 31 mmol/L (ref 20–31)
CREATININE: 1.03 mg/dL (ref 0.70–1.18)
Chloride: 102 mmol/L (ref 98–110)
GFR, EST NON AFRICAN AMERICAN: 71 mL/min (ref >=60)
GFR, Est African American: 82 mL/min (ref >=60)
Glucose, Bld: 99 mg/dL (ref 65–99)
POTASSIUM: 4.7 mmol/L (ref 3.5–5.3)
Sodium: 139 mmol/L (ref 135–146)
Total Bilirubin: 0.5 mg/dL (ref 0.2–1.2)
Total Protein: 7.1 g/dL (ref 6.1–8.1)

## 2015-03-13 LAB — CBC
HCT: 43.6 % (ref 39.0–52.0)
Hemoglobin: 15 g/dL (ref 13.0–17.0)
MCH: 30.1 pg (ref 26.0–34.0)
MCHC: 34.4 g/dL (ref 30.0–36.0)
MCV: 87.4 fL (ref 78.0–100.0)
MPV: 9.5 fL (ref 8.6–12.4)
PLATELETS: 278 10*3/uL (ref 150–400)
RBC: 4.99 MIL/uL (ref 4.22–5.81)
RDW: 12.5 % (ref 11.5–15.5)
WBC: 3.2 10*3/uL — ABNORMAL LOW (ref 4.0–10.5)

## 2015-03-13 LAB — LIPID PANEL
CHOL/HDL RATIO: 3.1 ratio (ref <=5.0)
Cholesterol: 162 mg/dL (ref 125–200)
HDL: 52 mg/dL (ref >=40)
LDL Cholesterol: 101 mg/dL (ref <130)
TRIGLYCERIDES: 46 mg/dL (ref <150)
VLDL: 9 mg/dL (ref <30)

## 2015-03-13 NOTE — Progress Notes (Signed)
   Subjective:    Patient ID: Eric Ferrell., male    DOB: 1941/01/09, 74 y.o.   MRN: [id number]  HPI    Review of Systems  Constitutional: Negative.   HENT: Negative.   Eyes: Negative.   Respiratory: Negative.   Cardiovascular: Negative.   Gastrointestinal: Negative.   Endocrine: Negative.   Genitourinary: Negative.   Musculoskeletal: Negative.   Skin: Negative.   Allergic/Immunologic: Negative.   Neurological: Negative.   Hematological: Negative.   Psychiatric/Behavioral: Negative.        Objective:   Physical Exam        Assessment & Plan:

## 2015-03-13 NOTE — Patient Instructions (Addendum)
You should receive a call or letter about your lab results within the next week to 10 days. Someone should be calling you about the ultrasound to screen for abdominal aneurysm Return to the clinic or go to the nearest emergency room if any of your symptoms worsen or new symptoms occur  Keeping you healthy  Get these tests  Blood pressure- Have your blood pressure checked once a year by your healthcare provider.  Normal blood pressure is 120/80  Weight- Have your body mass index (BMI) calculated to screen for obesity.  BMI is a measure of body fat based on height and weight. You can also calculate your own BMI at [email or web address].[email or web address].  Cholesterol- Have your cholesterol checked every year.  Diabetes- Have your blood sugar checked regularly if you have high blood pressure, high cholesterol, have a family history of diabetes or if you are overweight.  Screening for Colon Cancer- Colonoscopy starting at age 44.  Screening may begin sooner depending on your family history and other health conditions. Follow up colonoscopy as directed by your Gastroenterologist.  Screening for Prostate Cancer- Both blood work (PSA) and a rectal exam help screen for Prostate Cancer.  Screening begins at age 33 with African-American men and at age 53 with Caucasian men.  Screening may begin sooner depending on your family history.  Take these medicines  Aspirin- One aspirin daily can help prevent Heart disease and Stroke.  Flu shot- Every fall.  Tetanus- Every 10 years.  Zostavax- Once after the age of 23 to prevent Shingles.  Pneumonia shot- Once after the age of 65; if you are younger than 62, ask your healthcare provider if you need a Pneumonia shot.  Take these steps  Don't smoke- If you do smoke, talk to your doctor about quitting.  For tips on how to quit, go to www.smokefree.gov or call 1-800-QUIT-NOW.  Be physically active- Exercise 5 days a week for at least 30 minutes.  If you are not  already physically active start slow and gradually work up to 30 minutes of moderate physical activity.  Examples of moderate activity include walking briskly, mowing the yard, dancing, swimming, bicycling, etc.  Eat a healthy diet- Eat a variety of healthy food such as fruits, vegetables, low fat milk, low fat cheese, yogurt, lean meant, poultry, fish, beans, tofu, etc. For more information go to www.thenutritionsource.org  Drink alcohol in moderation- Limit alcohol intake to less than two drinks a day. Never drink and drive.  Dentist- Brush and floss twice daily; visit your dentist twice a year.  Depression- Your emotional health is as important as your physical health. If you're feeling down, or losing interest in things you would normally enjoy please talk to your healthcare provider.  Eye exam- Visit your eye doctor every year.  Safe sex- If you may be exposed to a sexually transmitted infection, use a condom.  Seat belts- Seat belts can save your life; always wear one.  Smoke/Carbon Monoxide detectors- These detectors need to be installed on the appropriate level of your home.  Replace batteries at least once a year.  Skin cancer- When out in the sun, cover up and use sunscreen 15 SPF or higher.  Violence- If anyone is threatening you, please tell your healthcare provider.  Living Will/ Health care power of attorney- Speak with your healthcare provider and family.

## 2015-03-13 NOTE — Progress Notes (Signed)
Subjective:    Patient ID: Eric Noble., male    DOB: 08/26/1940, 74 y.o.   MRN: [id number] By signing my name below, I, Zola Button, attest that this documentation has been prepared under the direction and in the presence of [name], MD.  Electronically Signed: Zola Button, Medical Scribe. 03/13/2015. 9:42 AM.  HPI HPI Comments: Eric Liljedahl. is a 74 y.o. male who presents to the [facility] for an annual exam. Last physical was in January 2015, which was the last time I saw him. No specific concerns today. He currently takes two daily aspirins and a multivitamin. Patient is fasting today. 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  Cancer screening:  Colon cancer screening - Referred last year as he had not had a colonoscopy at that time. Colonoscopy in February this year. There were 8 polyps, 7 of which were tubular adenomas. Plan on repeat colonoscopy in 3 years. Prostate cancer screening -  He agrees to have prostate cancer screening today. Patient denies any urinary symptoms. Lab Results  Component Value Date   PSA 0.64 04/11/2013    Immunizations: He refused the flu vaccine last year, but we did prescribe Zostavax. Pneumovax was given, he is due for Prevnar. Patient states he did have the flu vaccine last year, but he does have some concern of becoming ill due to the flu shot. He agrees to have the flu vaccine today. He is unsure when he last had the tetanus shot. Patient agrees to have the tetanus shot today despite having to pay for it out-of-pocket. Immunization History  Administered Date(s) Administered  . Influenza-Unspecified 04/08/2013  . Pneumococcal Polysaccharide-23 04/11/2013  . Zoster 04/08/2014    Abdominal aneurysm screening: History of tobacco use. Patient believes he had been smoking for 8-10 years.   Depression screening:  Depression screen [facility] 2/9 03/13/2015  Decreased Interest 0  Down,  Depressed, Hopeless 0  PHQ - 2 Score 0   Fall screening: 0 falls in the past year.  Functional status screening: No positive responses.  Cognitive function screening: Normal per survey. He denies any cognition changes.  Vision: He gets his eyes checked regularly.  Visual Acuity Screening   Right eye Left eye Both eyes  Without correction: 20/30 20/40 20/30   With correction:       Dentist: He saw his dentist last week.  Exercise: He reports exercising about 4 times a week.  Advanced directives: He does have a living will.  Heart murmur: He states he has had a heart murmur since he was born. He was referred to cardiology last visit. Patient does not remember the name of the cardiologist he saw. He did have a stress test and echocardiogram, and he was told his murmur was benign.  There are no active problems to display for this patient.  Past Medical History  Diagnosis Date  . Heart murmur   . Nervous stomach    Past Surgical History  Procedure Laterality Date  . Cystectomy      left leg   No Known Allergies Prior to Admission medications   Medication Sig Start Date End Date Taking? Authorizing Provider  aspirin 81 MG tablet Take 162 mg by mouth daily.    Yes Historical Provider, MD  Multiple Vitamins-Minerals (MULTIVITAMIN WITH MINERALS) tablet Take 1 tablet by mouth daily.   Yes Historical Provider, MD  zoster vaccine live, PF, (ZOSTAVAX) 29562 UNT/0.65ML injection Inject 19,400 Units  into the skin once. 04/11/13  Yes Wendie Agreste, MD   Social History   Social History  . Marital Status: Married    Spouse Name: N/A  . Number of Children: N/A  . Years of Education: N/A   Occupational History  . Herbalist    Social History Main Topics  . Smoking status: Former Smoker -- 5 years    Types: Cigarettes  . Smokeless tobacco: Never Used  . Alcohol Use: No  . Drug Use: No  . Sexual Activity: Yes   Other Topics Concern  . Not on file   Social History  Narrative   Married.   Education: College   Exercise: Yes        Review of Systems 13 point ROS reviewed on patient health survey. Negative other than listed above or in nursing note. See nursing note.    Objective:   Physical Exam  Constitutional: He is oriented to person, place, and time. He appears well-developed and well-nourished.  HENT:  Head: Normocephalic and atraumatic.  Right Ear: External ear normal.  Left Ear: External ear normal.  Mouth/Throat: Oropharynx is clear and moist.  Minimal cerumen left canal, non-obstructive.  Eyes: Conjunctivae and EOM are normal. Pupils are equal, round, and reactive to light.  Neck: Normal range of motion. Neck supple. Carotid bruit is not present. No thyromegaly present.  Cardiovascular: Normal rate, regular rhythm and intact distal pulses.   Murmur heard.  Systolic murmur is present  Grade 99991111 systolic murmur.  Pulmonary/Chest: Effort normal and breath sounds normal. No respiratory distress. He has no wheezes. He has no rales.  Abdominal: Soft. He exhibits no distension. There is no tenderness. Hernia confirmed negative in the right inguinal area and confirmed negative in the left inguinal area.  Genitourinary: Prostate normal.  Musculoskeletal: Normal range of motion. He exhibits no edema or tenderness.  Lymphadenopathy:    He has no cervical adenopathy.  Neurological: He is alert and oriented to person, place, and time. He has normal reflexes.  Skin: Skin is warm and dry.  Psychiatric: He has a normal mood and affect. His behavior is normal.  Vitals reviewed.     Filed Vitals:   03/13/15 0843  BP: 120/64  Pulse: 64  Temp: 98.6 F (37 C)  TempSrc: Oral  Resp: 16  Height: 6' (1.829 m)  Weight: 193 lb 12.8 oz (87.907 kg)  SpO2: 97%       Assessment & Plan:   Eric Minzey. is a 74 y.o. male Encounter for Medicare annual wellness exam  -anticipatory guidance as below in AVS, screening labs above. Health  maintenance items as above in HPI discussed/recommended as applicable.   Need for prophylactic vaccination and inoculation against influenza - Plan: Flu Vaccine QUAD 36+ mos IM given  Need for prophylactic vaccination against Streptococcus pneumoniae (pneumococcus) - Plan: Pneumococcal conjugate vaccine 13-valent IM given  Special screening, prostate cancer - Plan: PSA, Medicare  We discussed pros and cons of prostate cancer screening, and after this discussion, he chose to have screening done. PSA obtained, and no concerning findings on DRE.   Screening for AAA (abdominal aortic aneurysm) - Plan: US Abdomen Limited  -based on age and smoking hx. No apparent aneurysm on exam and asx.   Need for Tdap vaccination - Plan: Tdap vaccine greater than or equal to 7yo IM given.   Screening, anemia, deficiency, iron - Plan: CBC  Screening for diabetes mellitus - Plan: COMPLETE METABOLIC PANEL WITH GFR  Screening for hyperlipidemia - Plan: Lipid panel   No orders of the defined types were placed in this encounter.   Patient Instructions  You should receive a call or letter about your lab results within the next week to 10 days. Someone should be calling you about the ultrasound to screen for abdominal aneurysm Return to the clinic or go to the nearest emergency room if any of your symptoms worsen or new symptoms occur  Keeping you healthy  Get these tests  Blood pressure- Have your blood pressure checked once a year by your healthcare provider.  Normal blood pressure is 120/80  Weight- Have your body mass index (BMI) calculated to screen for obesity.  BMI is a measure of body fat based on height and weight. You can also calculate your own BMI at ViewBanking.[email or web address].  Cholesterol- Have your cholesterol checked every year.  Diabetes- Have your blood sugar checked regularly if you have high blood pressure, high cholesterol, have a family history of diabetes or if you are  overweight.  Screening for Colon Cancer- Colonoscopy starting at age 65.  Screening may begin sooner depending on your family history and other health conditions. Follow up colonoscopy as directed by your Gastroenterologist.  Screening for Prostate Cancer- Both blood work (PSA) and a rectal exam help screen for Prostate Cancer.  Screening begins at age 50 with African-American men and at age 72 with Caucasian men.  Screening may begin sooner depending on your family history.  Take these medicines  Aspirin- One aspirin daily can help prevent Heart disease and Stroke.  Flu shot- Every fall.  Tetanus- Every 10 years.  Zostavax- Once after the age of 42 to prevent Shingles.  Pneumonia shot- Once after the age of 79; if you are younger than 75, ask your healthcare provider if you need a Pneumonia shot.  Take these steps  Don't smoke- If you do smoke, talk to your doctor about quitting.  For tips on how to quit, go to www.smokefree.gov or call 1-800-QUIT-NOW.  Be physically active- Exercise 5 days a week for at least 30 minutes.  If you are not already physically active start slow and gradually work up to 30 minutes of moderate physical activity.  Examples of moderate activity include walking briskly, mowing the yard, dancing, swimming, bicycling, etc.  Eat a healthy diet- Eat a variety of healthy food such as fruits, vegetables, low fat milk, low fat cheese, yogurt, lean meant, poultry, fish, beans, tofu, etc. For more information go to www.thenutritionsource.org  Drink alcohol in moderation- Limit alcohol intake to less than two drinks a day. Never drink and drive.  Dentist- Brush and floss twice daily; visit your dentist twice a year.  Depression- Your emotional health is as important as your physical health. If you're feeling down, or losing interest in things you would normally enjoy please talk to your healthcare provider.  Eye exam- Visit your eye doctor every year.  Safe sex- If  you may be exposed to a sexually transmitted infection, use a condom.  Seat belts- Seat belts can save your life; always wear one.  Smoke/Carbon Monoxide detectors- These detectors need to be installed on the appropriate level of your home.  Replace batteries at least once a year.  Skin cancer- When out in the sun, cover up and use sunscreen 15 SPF or higher.  Violence- If anyone is threatening you, please tell your healthcare provider.  Living Will/ Health care power of attorney- Speak with your healthcare provider and family.  I personally performed the services described in this documentation, which was scribed in my presence. The recorded information has been reviewed and considered, and addended by me as needed.

## 2015-03-14 LAB — PSA, MEDICARE: PSA: 0.88 ng/mL (ref <=4.00)

## 2015-04-06 ENCOUNTER — Ambulatory Visit
Admission: RE | Admit: 2015-04-06 | Discharge: 2015-04-06 | Disposition: A | Discharge status: Home / Self Care | Payer: Medicare Other | Source: Ambulatory Visit | Attending: Family Medicine | Admitting: Family Medicine

## 2015-04-06 ENCOUNTER — Other Ambulatory Visit: Payer: Self-pay | Admitting: Family Medicine

## 2015-04-06 DIAGNOSIS — Z136 Encounter for screening for cardiovascular disorders: Secondary | ICD-10-CM

## 2015-04-13 ENCOUNTER — Telehealth: Payer: Self-pay | Admitting: Radiology

## 2016-01-11 ENCOUNTER — Encounter: Payer: Self-pay | Admitting: Family Medicine

## 2016-01-11 ENCOUNTER — Ambulatory Visit (INDEPENDENT_AMBULATORY_CARE_PROVIDER_SITE_OTHER): Payer: Medicare Other | Admitting: Family Medicine

## 2016-01-11 VITALS — BP 128/72 | HR 51 | Temp 98.7°F | Resp 16 | Ht 72.0 in | Wt 201.8 lb

## 2016-01-11 DIAGNOSIS — Z13220 Encounter for screening for lipoid disorders: Secondary | ICD-10-CM

## 2016-01-11 DIAGNOSIS — Z131 Encounter for screening for diabetes mellitus: Secondary | ICD-10-CM

## 2016-01-11 DIAGNOSIS — R011 Cardiac murmur, unspecified: Secondary | ICD-10-CM | POA: Diagnosis not present

## 2016-01-11 DIAGNOSIS — Z23 Encounter for immunization: Secondary | ICD-10-CM | POA: Diagnosis not present

## 2016-01-11 DIAGNOSIS — R7989 Other specified abnormal findings of blood chemistry: Secondary | ICD-10-CM

## 2016-01-11 LAB — CBC WITH DIFFERENTIAL/PLATELET
BASOS PCT: 0 %
Basophils Absolute: 0 cells/uL (ref 0–200)
EOS PCT: 2 %
Eosinophils Absolute: 90 cells/uL (ref 15–500)
HCT: 42.2 % (ref 38.5–50.0)
HEMOGLOBIN: 14 g/dL (ref 13.2–17.1)
Lymphocytes Relative: 32 %
Lymphs Abs: 1440 cells/uL (ref 850–3900)
MCH: 28.8 pg (ref 27.0–33.0)
MCHC: 33.2 g/dL (ref 32.0–36.0)
MCV: 86.8 fL (ref 80.0–100.0)
MONO ABS: 585 {cells}/uL (ref 200–950)
MPV: 9.3 fL (ref 7.5–12.5)
Monocytes Relative: 13 %
NEUTROS ABS: 2385 {cells}/uL (ref 1500–7800)
NEUTROS PCT: 53 %
Platelets: 250 10*3/uL (ref 140–400)
RBC: 4.86 MIL/uL (ref 4.20–5.80)
RDW: 12.3 % (ref 11.0–15.0)
WBC: 4.5 10*3/uL (ref 3.8–10.8)

## 2016-01-11 NOTE — Progress Notes (Signed)
Subjective:  By signing my name below, I, Eric Ferrell, attest that this documentation has been prepared under the direction and in the presence of [name], MD. Electronically Signed: Moises Ferrell, [place]. 01/11/2016 , 10:20 AM .  Patient was seen in Room 21 .   Patient ID: Eric Ferrell., male    DOB: 06/07/1940, 75 y.o.   MRN: [id number] Chief Complaint  Patient presents with  . Annual Exam   HPI Eric Ferrell. is a 75 y.o. male He was originally scheduled for annual physical but too early per insurance.   Lab work His lipid, CMP, and PSA were normal at last physical in Dec 2016. He had borderline low WBC but other Ferrell counts were normal.   Neutropenia  Borderline low last visit, and he had borderline low WBC 3.3 2 years ago. But, this was up to 4.2 a year ago. He denies unexplained weight loss or chills.   Heart murmur He was referred to cardiologist previously for his heart murmur. He had stress test and echo done, which were normal and informed his murmur is benign. He denies chest pain, palpitations, or shortness of breath.   Health maintenance Prostate cancer screening: normal at 0.88 in Dec 2016.   Immunizations He received a flu shot today.   There are no active problems to display for this patient.  Past Medical History:  Diagnosis Date  . Heart murmur   . Nervous stomach    Past Surgical History:  Procedure Laterality Date  . CYSTECTOMY     left leg   No Known Allergies Prior to Admission medications   Medication Sig Start Date End Date Taking? Authorizing Provider  aspirin 81 MG tablet Take 162 mg by mouth daily.    Yes Historical Provider, MD  Multiple Vitamins-Minerals (MULTIVITAMIN WITH MINERALS) tablet Take 1 tablet by mouth daily.   Yes Historical Provider, MD   Social History   Social History  . Marital status: Married    Spouse name: N/A  . Number of children: N/A  . Years of education: N/A   Occupational History  .  Herbalist    Social History Main Topics  . Smoking status: Former Smoker    Years: 5.00    Types: Cigarettes  . Smokeless tobacco: Never Used  . Alcohol use No  . Drug use: No  . Sexual activity: Yes   Other Topics Concern  . Not on file   Social History Narrative   Married.   Education: College   Exercise: Yes      Review of Systems  Constitutional: Negative for fatigue and unexpected weight change.  Eyes: Negative for visual disturbance.  Respiratory: Negative for cough, chest tightness and shortness of breath.   Cardiovascular: Negative for chest pain, palpitations and leg swelling.  Gastrointestinal: Negative for abdominal pain and Ferrell in stool.  Neurological: Negative for dizziness, light-headedness and headaches.       Objective:   Physical Exam  Constitutional: He is oriented to person, place, and time. He appears well-developed and well-nourished.  HENT:  Head: Normocephalic and atraumatic.  Eyes: EOM are normal. Pupils are equal, round, and reactive to light.  Neck: No JVD present. Carotid bruit is not present.  Cardiovascular: Normal rate and regular rhythm.   Murmur heard.  Systolic murmur is present with a grade of 2/6  Pulmonary/Chest: Effort normal and breath sounds normal. He has no rales.  Abdominal: Soft. Bowel sounds are normal. He exhibits no distension. There  is no tenderness.  Musculoskeletal: He exhibits no edema.  Neurological: He is alert and oriented to person, place, and time.  Skin: Skin is warm and dry.  Psychiatric: He has a normal mood and affect.  Vitals reviewed.   Vitals:   01/11/16 0936  BP: 128/72  Pulse: (!) 51  Resp: 16  Temp: 98.7 F (37.1 C)  TempSrc: Oral  SpO2: 96%  Weight: 201 lb 12.8 oz (91.5 kg)  Height: 6' (1.829 m)      Assessment & Plan:  [name]. is a 75 y.o. male Abnormal CBC - Plan: CBC with Differential/Platelet  - Borderline neutropenia, asymptomatic. Repeat CBC.  Need for  prophylactic vaccination and inoculation against influenza - Plan: Flu Vaccine QUAD 36+ mos IM given  Screening for hyperlipidemia - Plan: Lipid panel  Screening for diabetes mellitus - Plan: COMPLETE METABOLIC PANEL WITH GFR  Heart murmur  - Reportedly had negative workup previously, asymptomatic.  Plan on follow-up in next approximate 3 months for Medicare physical as too early to have that performed today.  No orders of the defined types were placed in this encounter.  Patient Instructions    I will recheck your Ferrell count today, and if the white Ferrell cells are still low, can look into other causes. I also checked your screening cholesterol, liver, and electrolyte tests. Schedule a physical with me after December 5th as your previous physical was less than a year ago. Let me know if you have any questions in the meantime.   IF you received an x-ray today, you will receive an invoice from [facility]. Please contact [facility] at [phone] with questions or concerns regarding your invoice.   IF you received labwork today, you will receive an invoice from [employer]. Please contact [facility] at [phone] with questions or concerns regarding your invoice.   Our billing staff will not be able to assist you with questions regarding bills from these companies.  You will be contacted with the lab results as soon as they are available. The fastest way to get your results is to activate your My Chart account. Instructions are located on the last page of this paperwork. If you have not heard from [place] regarding the results in 2 weeks, please contact this office.        I personally performed the services described in this documentation, which was scribed in my presence. The recorded information has been reviewed and considered, and addended by me as needed.   Signed,   [name], MD [facility].  01/11/16 10:25 AM

## 2016-01-11 NOTE — Patient Instructions (Signed)
  I will recheck your blood count today, and if the white blood cells are still low, can look into other causes. I also checked your screening cholesterol, liver, and electrolyte tests. Schedule a physical with me after December 5th as your previous physical was less than a year ago. Let me know if you have any questions in the meantime.   IF you received an x-ray today, you will receive an invoice from [facility]. Please contact [facility] at [phone] with questions or concerns regarding your invoice.   IF you received labwork today, you will receive an invoice from [employer]. Please contact [facility] at [phone] with questions or concerns regarding your invoice.   Our billing staff will not be able to assist you with questions regarding bills from these companies.  You will be contacted with the lab results as soon as they are available. The fastest way to get your results is to activate your My Chart account. Instructions are located on the last page of this paperwork. If you have not heard from [place] regarding the results in 2 weeks, please contact this office.

## 2016-01-12 LAB — COMPLETE METABOLIC PANEL WITH GFR
ALBUMIN: 4.2 g/dL (ref 3.6–5.1)
ALK PHOS: 55 U/L (ref 40–115)
ALT: 14 U/L (ref 9–46)
AST: 21 U/L (ref 10–35)
BILIRUBIN TOTAL: 0.6 mg/dL (ref 0.2–1.2)
BUN: 16 mg/dL (ref 7–25)
CO2: 26 mmol/L (ref 20–31)
CREATININE: 0.96 mg/dL (ref 0.70–1.18)
Calcium: 9.6 mg/dL (ref 8.6–10.3)
Chloride: 103 mmol/L (ref 98–110)
GFR, Est African American: 89 mL/min (ref >=60)
GFR, Est Non African American: 77 mL/min (ref >=60)
Glucose, Bld: 83 mg/dL (ref 65–99)
Potassium: 4.6 mmol/L (ref 3.5–5.3)
SODIUM: 139 mmol/L (ref 135–146)
Total Protein: 6.8 g/dL (ref 6.1–8.1)

## 2016-01-12 LAB — LIPID PANEL
Cholesterol: 160 mg/dL (ref 125–200)
HDL: 43 mg/dL (ref >=40)
LDL Cholesterol: 103 mg/dL (ref <130)
Total CHOL/HDL Ratio: 3.7 Ratio (ref <=5.0)
Triglycerides: 72 mg/dL (ref <150)
VLDL: 14 mg/dL (ref <30)

## 2016-03-14 ENCOUNTER — Ambulatory Visit (INDEPENDENT_AMBULATORY_CARE_PROVIDER_SITE_OTHER): Payer: Medicare Other | Admitting: Family Medicine

## 2016-03-14 ENCOUNTER — Encounter: Payer: Self-pay | Admitting: Family Medicine

## 2016-03-14 VITALS — BP 116/66 | HR 59 | Temp 98.4°F | Resp 16 | Ht 72.0 in | Wt 200.2 lb

## 2016-03-14 DIAGNOSIS — Z0000 Encounter for general adult medical examination without abnormal findings: Secondary | ICD-10-CM | POA: Diagnosis not present

## 2016-03-14 DIAGNOSIS — Z125 Encounter for screening for malignant neoplasm of prostate: Secondary | ICD-10-CM | POA: Diagnosis not present

## 2016-03-14 NOTE — Patient Instructions (Addendum)
  Schedule appointment with eye doctor.      IF you received an x-ray today, you will receive an invoice from [facility]. Please contact [facility] at [phone] with questions or concerns regarding your invoice.   IF you received labwork today, you will receive an invoice from [employer]. Please contact [facility] at [phone] with questions or concerns regarding your invoice.   Our billing staff will not be able to assist you with questions regarding bills from these companies.  You will be contacted with the lab results as soon as they are available. The fastest way to get your results is to activate your My Chart account. Instructions are located on the last page of this paperwork. If you have not heard from [place] regarding the results in 2 weeks, please contact this office.

## 2016-03-14 NOTE — Progress Notes (Signed)
By signing my name below, I, Mesha Guinyard, attest that this documentation has been prepared under the direction and in the presence of [name], MD.  Electronically Signed: Verlee Monte, Medical Scribe. 03/14/16. 3:23 PM.  Subjective:    Patient ID: Eric Noble., male    DOB: 1940-09-18, 75 y.o.   MRN: [id number]  HPI  Chief Complaint  Patient presents with  . Annual Exam    HPI Comments: Eric Doudna. is a 75 y.o. male who presents to the [facility] for his annual physical. Last wellness visit was Dec 2016.  Cancer Screening Colon CA: Colonoscopy Feb 2016 Dr. Hilarie Fredrickson. 8 polyps removed, tubular adenomas noted  Prostate CA: Agrees to get PSA screening today. FHx: none Lab Results  Component Value Date   PSA 0.88 03/13/2015   PSA 0.64 04/11/2013   Immunizations: Reports having "sniffles" and cough after the flu shot, but these sxs have resolved. Immunization History  Administered Date(s) Administered  . Influenza,inj,Quad PF,36+ Mos 03/13/2015, 01/11/2016  . Influenza-Unspecified 04/08/2013  . Pneumococcal Conjugate-13 03/13/2015  . Pneumococcal Polysaccharide-23 04/11/2013  . Tdap 03/13/2015  . Zoster 04/08/2014   Vision: Last saw optometrist 2 years ago.  Visual Acuity Screening   Right eye Left eye Both eyes  Without correction:     With correction: 20/40 20/50 20/30    Dentist: Is followed by a dentist.  Exercise: 3-4x a week. Pt went to cardiologist 2-3 years ago and they didn't find anything concerning. Denies chest pain, trouble breathing, SOB, chest tightness, light-headedness and other sxs.  Depression Screening: Depression screen [facility] 2/9 03/14/2016 01/11/2016 03/13/2015  Decreased Interest 0 0 0  Down, Depressed, Hopeless 0 0 0  PHQ - 2 Score 0 0 0   Fall Screening: Fall Risk  03/14/2016 01/11/2016 03/13/2015  Falls in the past year? No No No   Functional Status Survey: Is the patient deaf or have difficulty hearing?:  No Does the patient have difficulty seeing, even when wearing glasses/contacts?: No Does the patient have difficulty concentrating, remembering, or making decisions?: No Does the patient have difficulty walking or climbing stairs?: No Does the patient have difficulty dressing or bathing?: No Does the patient have difficulty doing errands alone such as visiting a doctor's office or shopping?: No  Advance directives: Has a living will.  There are no active problems to display for this patient.  Past Medical History:  Diagnosis Date  . Heart murmur   . Nervous stomach    Past Surgical History:  Procedure Laterality Date  . CYSTECTOMY     left leg   No Known Allergies Prior to Admission medications   Medication Sig Start Date End Date Taking? Authorizing Provider  aspirin 81 MG tablet Take 162 mg by mouth daily.    Yes Historical Provider, MD  Multiple Vitamins-Minerals (MULTIVITAMIN WITH MINERALS) tablet Take 1 tablet by mouth daily.   Yes Historical Provider, MD   Social History   Social History  . Marital status: Married    Spouse name: N/A  . Number of children: N/A  . Years of education: N/A   Occupational History  . Herbalist    Social History Main Topics  . Smoking status: Former Smoker    Years: 5.00    Types: Cigarettes  . Smokeless tobacco: Never Used  . Alcohol use No  . Drug use: No  . Sexual activity: Yes   Other Topics Concern  . Not on file   Social History  Narrative   Married.   Education: College   Exercise: Yes      Review of Systems  13 point ROS is negative. Objective:  Physical Exam  Constitutional: He is oriented to person, place, and time. He appears well-developed and well-nourished.  HENT:  Head: Normocephalic and atraumatic.  Right Ear: External ear normal.  Left Ear: External ear normal.  Mouth/Throat: Oropharynx is clear and moist.  Eyes: Conjunctivae and EOM are normal. Pupils are equal, round, and reactive to  light.  Neck: Normal range of motion. Neck supple. No thyromegaly present.  Cardiovascular: Normal rate, regular rhythm and intact distal pulses.   Murmur heard.  Systolic (Grade 2-3) murmur is present  Pulmonary/Chest: Effort normal and breath sounds normal. No respiratory distress. He has no wheezes.  Abdominal: Soft. He exhibits no distension. There is no tenderness. Hernia confirmed negative in the right inguinal area and confirmed negative in the left inguinal area.  Genitourinary: Prostate normal.  Musculoskeletal: Normal range of motion. He exhibits no edema or tenderness.  Lymphadenopathy:    He has no cervical adenopathy.  Neurological: He is alert and oriented to person, place, and time. He has normal reflexes.  Skin: Skin is warm and dry.  Psychiatric: He has a normal mood and affect. His behavior is normal.  Vitals reviewed.  BP 116/66 (BP Location: Left Arm, Patient Position: Sitting, Cuff Size: Large)   Pulse (!) 59   Temp 98.4 F (36.9 C) (Oral)   Resp 16   Ht 6' (1.829 m)   Wt 200 lb 3.2 oz (90.8 kg)   SpO2 96%   BMI 27.15 kg/m   Assessment & Plan:  Eric Prairie. is a 75 y.o. male Medicare annual wellness visit, subsequent  -anticipatory guidance as below in AVS, screening labs above. Health maintenance items as above in HPI discussed/recommended as applicable.  Depression, functional status, fall screening as above. No concerning findings on history. Advanced directives discussed.  Screening for prostate cancer - Plan: PSA  - We discussed pros and cons of prostate cancer screening, and after this discussion, he chose to have screening done. PSA obtained, and no concerning findings on DRE.    No orders of the defined types were placed in this encounter.  Patient Instructions    Schedule appointment with eye doctor.      IF you received an x-ray today, you will receive an invoice from [facility]. Please contact [facility] at  [phone] with questions or concerns regarding your invoice.   IF you received labwork today, you will receive an invoice from [employer]. Please contact [facility] at [phone] with questions or concerns regarding your invoice.   Our billing staff will not be able to assist you with questions regarding bills from these companies.  You will be contacted with the lab results as soon as they are available. The fastest way to get your results is to activate your My Chart account. Instructions are located on the last page of this paperwork. If you have not heard from [place] regarding the results in 2 weeks, please contact this office.       I personally performed the services described in this documentation, which was scribed in my presence. The recorded information has been reviewed and considered, and addended by me as needed.   Signed,   [name], MD [facility].  03/16/16 4:51 PM

## 2016-03-15 LAB — PSA: PROSTATE SPECIFIC AG, SERUM: 1.3 ng/mL (ref 0.0–4.0)

## 2016-10-23 IMAGING — US US AORTA
1 series · 14 of 25 positions shown · non-contrast
Comparison: None.

CLINICAL DATA: Family history of abdominal aortic aneurysm. Remote
history of smoking.

EXAM:
ULTRASOUND OF ABDOMINAL AORTA
TECHNIQUE: Ultrasound examination of the abdominal aorta was performed to
evaluate for abdominal aortic aneurysm.

[Series 1: us aorta · 0.24mm/px · 14 of 26 slices shown]
[im 1/26]
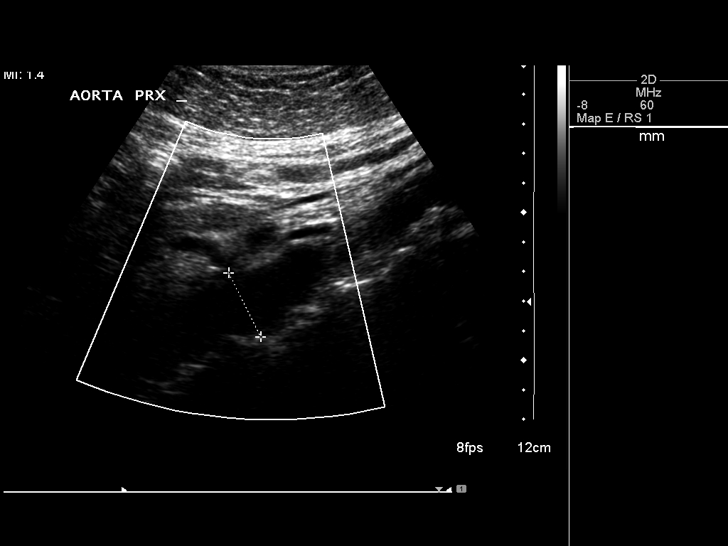
[im 3/26]
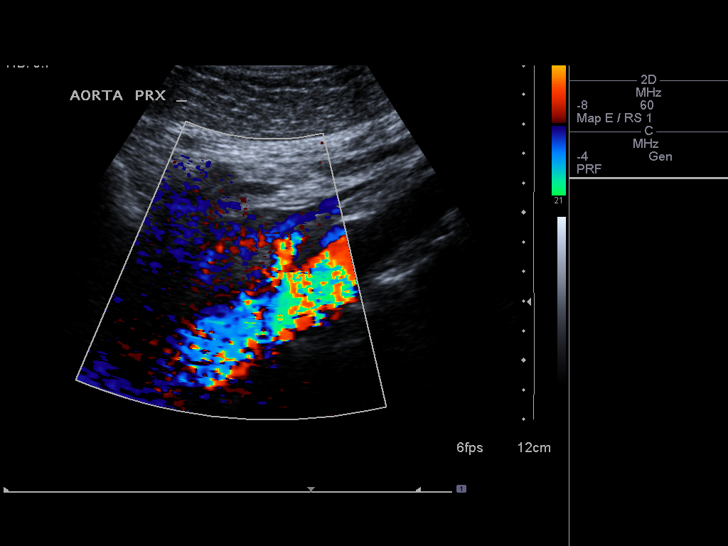
[im 5/26]
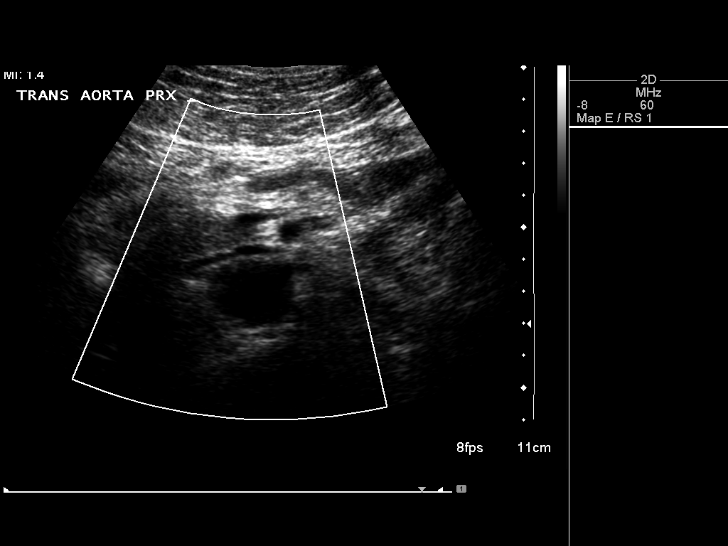
[im 7/26]
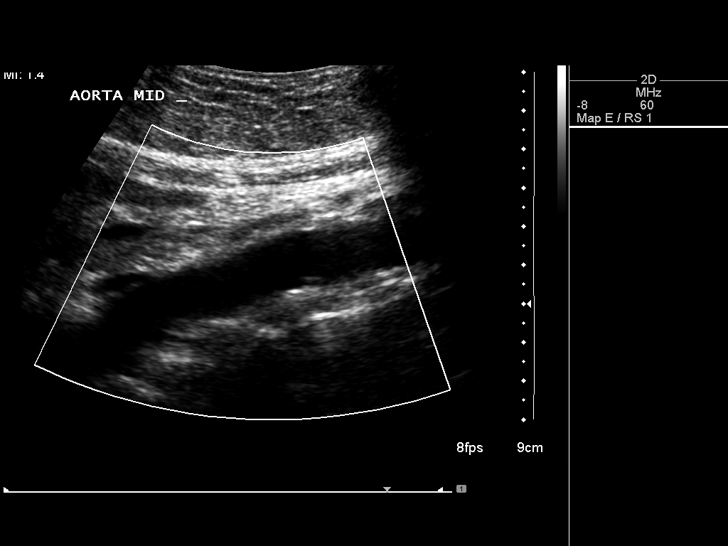
[im 9/26]
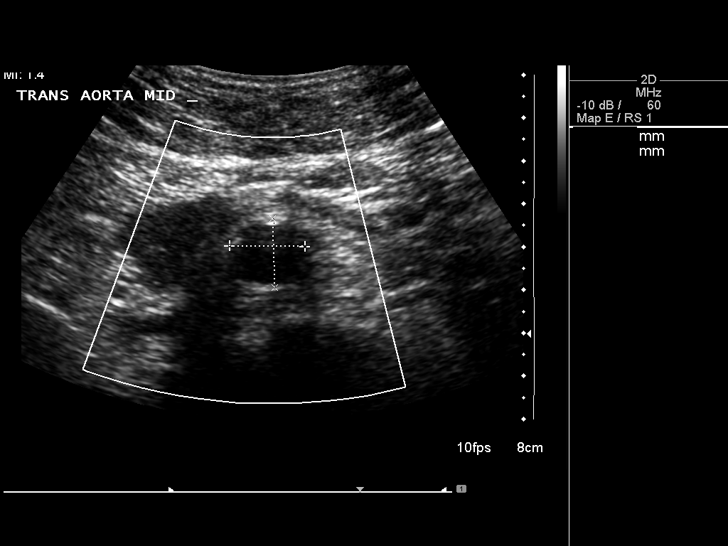
[im 10/26]
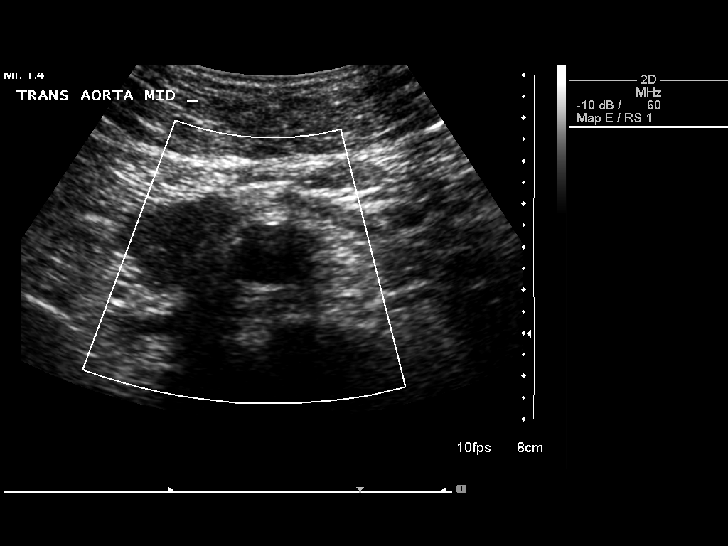
[im 12/26]
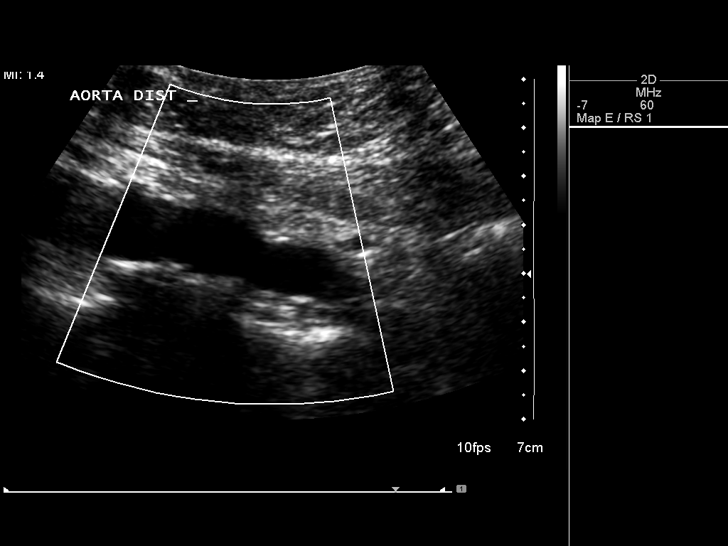
[im 14/26]
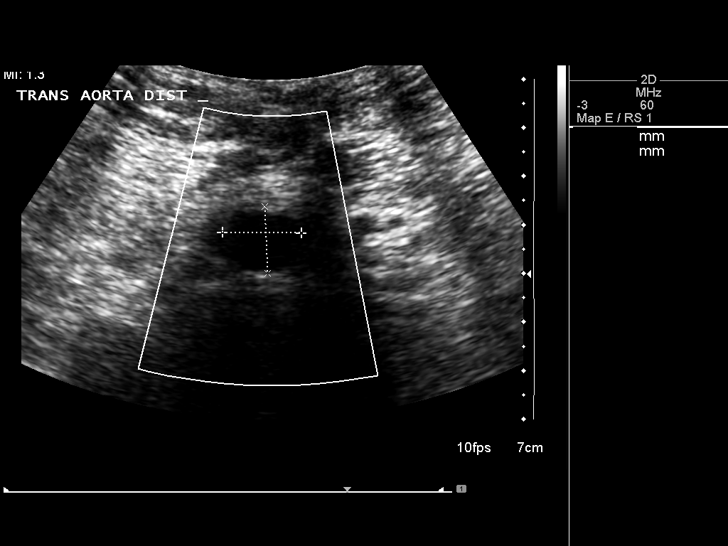
[im 16/26]
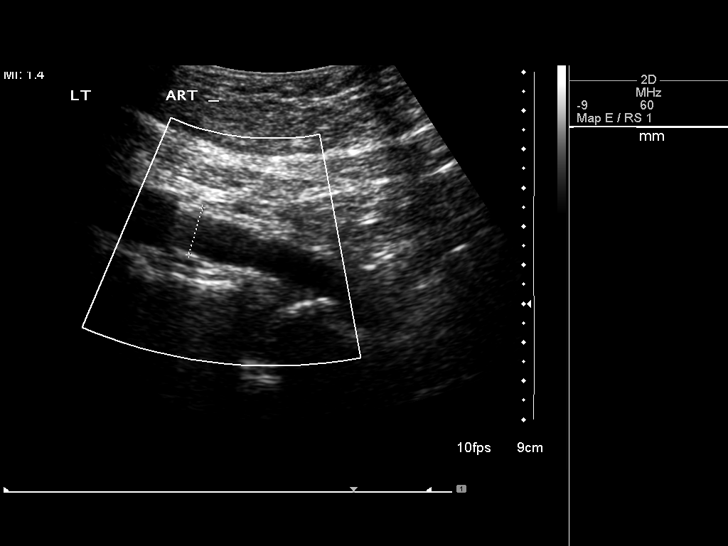
[im 17/26]
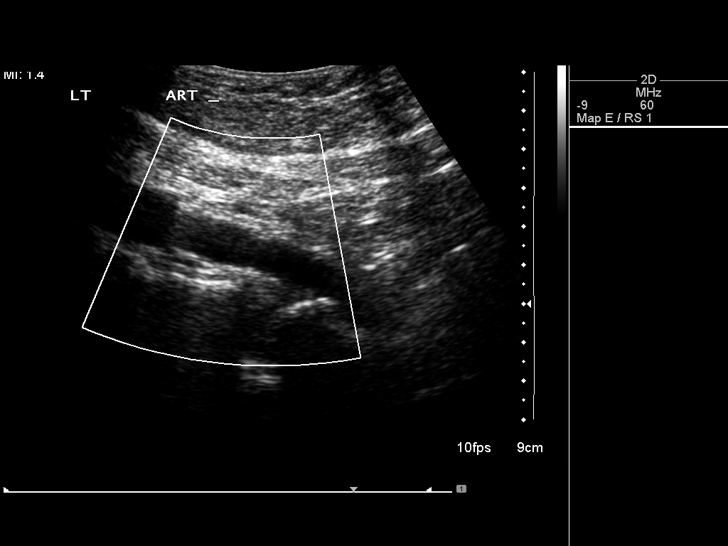
[im 19/26]
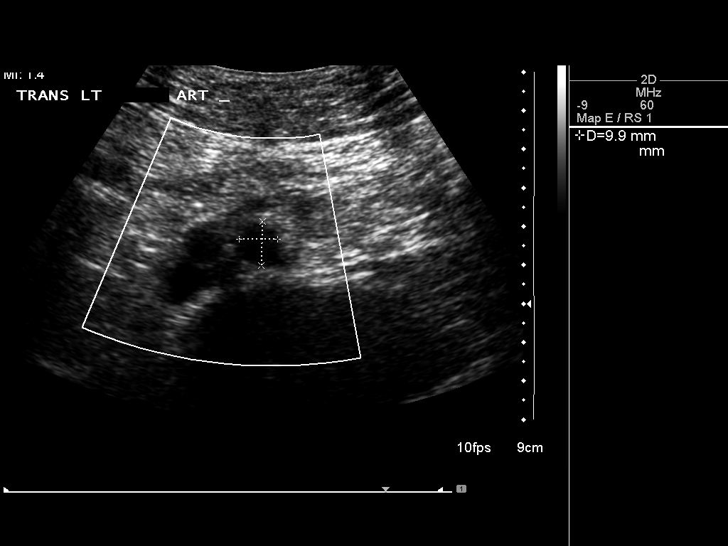
[im 21/26]
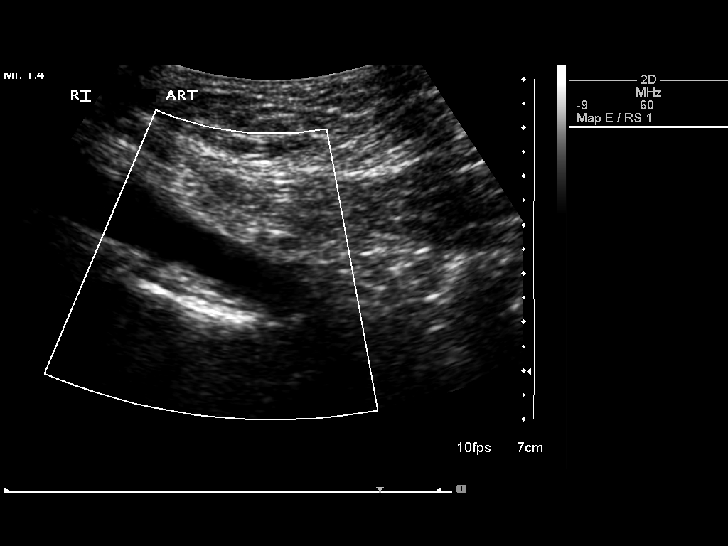
[im 23/26]
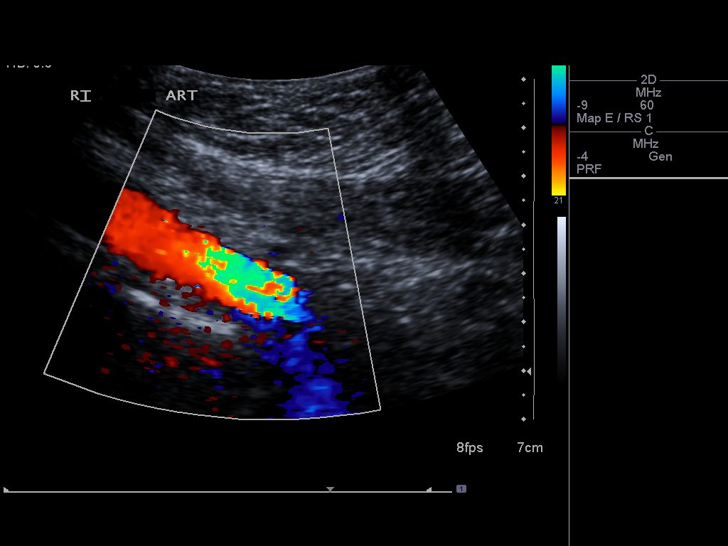
[im 26/26]
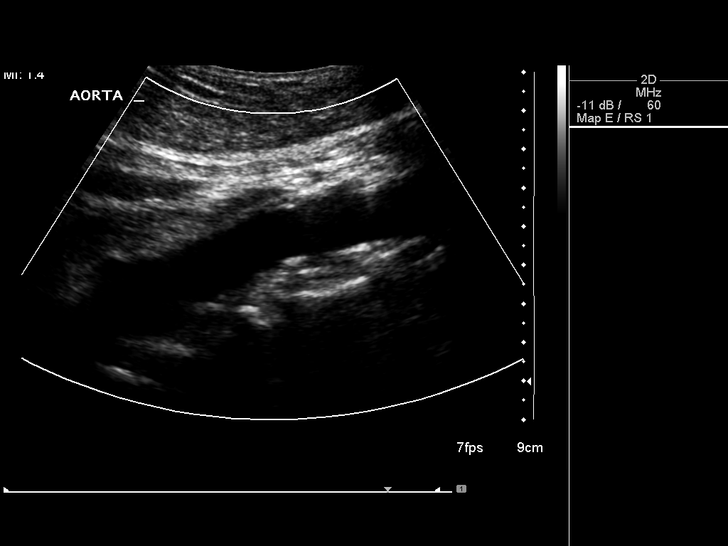

[14 of 25 positions shown; findings below may reference images not displayed]

FINDINGS: Abdominal Aorta

Proximal:  2.4 x 2.8 cm

Mid:  1.7 x 1.8 cm

Distal:  1.3 x 1.6 cm

Right Common iliac artery:  1.0 x 1.2 cm

Left Common iliac artery:  1.3 x 1.0 cm

Atherosclerotic changes noted.
IMPRESSION: No abdominal aortic aneurysm.

## 2017-03-21 ENCOUNTER — Ambulatory Visit: Payer: Medicare Other | Admitting: Family Medicine

## 2017-03-21 ENCOUNTER — Other Ambulatory Visit: Payer: Self-pay

## 2017-03-21 ENCOUNTER — Encounter: Payer: Self-pay | Admitting: Family Medicine

## 2017-03-21 VITALS — BP 126/76 | HR 60 | Temp 98.6°F | Resp 18 | Ht 72.0 in | Wt 204.6 lb

## 2017-03-21 DIAGNOSIS — Z0000 Encounter for general adult medical examination without abnormal findings: Secondary | ICD-10-CM | POA: Diagnosis not present

## 2017-03-21 DIAGNOSIS — R0989 Other specified symptoms and signs involving the circulatory and respiratory systems: Secondary | ICD-10-CM | POA: Diagnosis not present

## 2017-03-21 DIAGNOSIS — Z23 Encounter for immunization: Secondary | ICD-10-CM | POA: Diagnosis not present

## 2017-03-21 MED ORDER — ZOSTER VAC RECOMB ADJUVANTED 50 MCG/0.5ML IM SUSR: 0.5000 mL | Freq: Once | INTRAMUSCULAR | 1 refills | Status: AC

## 2017-03-21 NOTE — Patient Instructions (Signed)
Shingles vaccines sent to your pharmacy. I do not see need for other blood work today. I will schedule an ultrasound of the left carotid for the possible noise in that area, but that may be related to the slight heart murmur. Let me know if you have any questions, otherwise follow-up in 1 year for physical. Thanks for coming in today.    Preventive Care 76 Years and Older, Male Preventive care refers to lifestyle choices and visits with your health care provider that can promote health and wellness. What does preventive care include?  A yearly physical exam. This is also called an annual well check.  Dental exams once or twice a year.  Routine eye exams. Ask your health care provider how often you should have your eyes checked.  Personal lifestyle choices, including: ? Daily care of your teeth and gums. ? Regular physical activity. ? Eating a healthy diet. ? Avoiding tobacco and drug use. ? Limiting alcohol use. ? Practicing safe sex. ? Taking low doses of aspirin every day. ? Taking vitamin and mineral supplements as recommended by your health care provider. What happens during an annual well check? The services and screenings done by your health care provider during your annual well check will depend on your age, overall health, lifestyle risk factors, and family history of disease. Counseling Your health care provider may ask you questions about your:  Alcohol use.  Tobacco use.  Drug use.  Emotional well-being.  Home and relationship well-being.  Sexual activity.  Eating habits.  History of falls.  Memory and ability to understand (cognition).  Work and work Statistician.  Screening You may have the following tests or measurements:  Height, weight, and BMI.  Blood pressure.  Lipid and cholesterol levels. These may be checked every 5 years, or more frequently if you are over 76 years old.  Skin check.  Lung cancer screening. You may have this screening  every year starting at age 76 if you have a 30-pack-year history of smoking and currently smoke or have quit within the past 15 years.  Fecal occult blood test (FOBT) of the stool. You may have this test every year starting at age 76.  Flexible sigmoidoscopy or colonoscopy. You may have a sigmoidoscopy every 5 years or a colonoscopy every 10 years starting at age 76.  Prostate cancer screening. Recommendations will vary depending on your family history and other risks.  Hepatitis C blood test.  Hepatitis B blood test.  Sexually transmitted disease (STD) testing.  Diabetes screening. This is done by checking your blood sugar (glucose) after you have not eaten for a while (fasting). You may have this done every 1-3 years.  Abdominal aortic aneurysm (AAA) screening. You may need this if you are a current or former smoker.  Osteoporosis. You may be screened starting at age 76 if you are at high risk.  Talk with your health care provider about your test results, treatment options, and if necessary, the need for more tests. Vaccines Your health care provider may recommend certain vaccines, such as:  Influenza vaccine. This is recommended every year.  Tetanus, diphtheria, and acellular pertussis (Tdap, Td) vaccine. You may need a Td booster every 10 years.  Varicella vaccine. You may need this if you have not been vaccinated.  Zoster vaccine. You may need this after age 76.  Measles, mumps, and rubella (MMR) vaccine. You may need at least one dose of MMR if you were born in 1957 or later. You may  also need a second dose.  Pneumococcal 13-valent conjugate (PCV13) vaccine. One dose is recommended after age 76.  Pneumococcal polysaccharide (PPSV23) vaccine. One dose is recommended after age 76.  Meningococcal vaccine. You may need this if you have certain conditions.  Hepatitis A vaccine. You may need this if you have certain conditions or if you travel or work in places where you may  be exposed to hepatitis A.  Hepatitis B vaccine. You may need this if you have certain conditions or if you travel or work in places where you may be exposed to hepatitis B.  Haemophilus influenzae type b (Hib) vaccine. You may need this if you have certain risk factors.  Talk to your health care provider about which screenings and vaccines you need and how often you need them. This information is not intended to replace advice given to you by your health care provider. Make sure you discuss any questions you have with your health care provider. Document Released: 04/21/2015 Document Revised: 12/13/2015 Document Reviewed: 01/24/2015 Elsevier Interactive Patient Education  2017 [employer].   IF you received an x-ray today, you will receive an invoice from [facility]. Please contact [facility] at [phone] with questions or concerns regarding your invoice.   IF you received labwork today, you will receive an invoice from [facility]. Please contact [facility] at [phone] with questions or concerns regarding your invoice.   Our billing staff will not be able to assist you with questions regarding bills from these companies.  You will be contacted with the lab results as soon as they are available. The fastest way to get your results is to activate your My Chart account. Instructions are located on the last page of this paperwork. If you have not heard from [place] regarding the results in 2 weeks, please contact this office.

## 2017-03-21 NOTE — Progress Notes (Signed)
Subjective:  By signing my name below, I, Eric Ferrell, attest that this documentation has been prepared under the direction and in the presence of Eric Agreste, MD Electronically Signed: Ladene Artist, ED Scribe 03/21/2017 at 11:18 AM.   Patient ID: Eric Ferrell., male    DOB: Sep 04, 1940, 76 y.o.   MRN: [id number]  Chief Complaint  Patient presents with  . Annual Exam   HPI Eric Ferrell. is a 76 y.o. male who presents to Primary Care at [facility] for an annual exam. Last wellness exam 03/2016.  Family Hx Mother passed of stroke at age 17. Unsure of father's hx as he was killed when pt was 6 m.o. Pt denies light-headedness, dizziness.  CA Screening Colonoscopy: 05/2014, tubular adenomas  Prostate CA Screening:  Lab Results  Component Value Date   PSA 0.88 03/13/2015   PSA 0.64 04/11/2013   Immunizations Immunization History  Administered Date(s) Administered  . Influenza,inj,Quad PF,6+ Mos 03/13/2015, 01/11/2016, 03/21/2017  . Influenza-Unspecified 04/08/2013  . Pneumococcal Conjugate-13 03/13/2015  . Pneumococcal Polysaccharide-23 04/11/2013  . Tdap 03/13/2015  . Zoster 04/08/2014   Fall Screening Denies falls within the past year.  Mental Status Screening  6CIT Screen 03/21/2017  What Year? 0 points  What month? 0 points  What time? 0 points  Count back from 20 0 points  Months in reverse 0 points  Repeat phrase 0 points  Total Score 0    Depression Screening Depression screen [facility] 2/9 03/21/2017 03/14/2016 01/11/2016 03/13/2015  Decreased Interest 0 0 0 0  Down, Depressed, Hopeless 0 0 0 0  PHQ - 2 Score 0 0 0 0     Visual Acuity Screening   Right eye Left eye Both eyes  Without correction: 20/20-1 20/40-1 20/20-1  With correction:      Vision: last visit in September Dentist: last seen in March Exercise:   Functional Status Survey:    Functional Status Survey: Is the patient deaf or have difficulty hearing?: No Does the patient have  difficulty seeing, even when wearing glasses/contacts?: No Does the patient have difficulty concentrating, remembering, or making decisions?: No Does the patient have difficulty walking or climbing stairs?: No Does the patient have difficulty dressing or bathing?: No Does the patient have difficulty doing errands alone such as visiting a doctor's office or shopping?: No   Advanced Directives Pt does not a healthcare will.  Past Medical History:  Diagnosis Date  . Heart murmur   . Nervous stomach    Past Surgical History:  Procedure Laterality Date  . CYSTECTOMY     left leg   No Known Allergies Prior to Admission medications   Medication Sig Start Date End Date Taking? Authorizing Provider  aspirin 81 MG tablet Take 162 mg by mouth daily.     [provider]  Multiple Vitamins-Minerals (MULTIVITAMIN WITH MINERALS) tablet Take 1 tablet by mouth daily.    [provider]   Social History   Socioeconomic History  . Marital status: Married    Spouse name: Not on file  . Number of children: Not on file  . Years of education: Not on file  . Highest education level: Not on file  Social Needs  . Financial resource strain: Not on file  . Food insecurity - worry: Not on file  . Food insecurity - inability: Not on file  . Transportation needs - medical: Not on file  . Transportation needs - non-medical: Not on file  Occupational History  .  Occupation: Herbalist  Tobacco Use  . Smoking status: Former Smoker    Years: 5.00    Types: Cigarettes  . Smokeless tobacco: Never Used  Substance and Sexual Activity  . Alcohol use: No    Alcohol/week: 0.0 oz  . Drug use: No  . Sexual activity: Yes  Other Topics Concern  . Not on file  Social History Narrative   Married.   Education: College   Exercise: Yes   Review of Systems  Neurological: Negative for dizziness and light-headedness.  All other systems reviewed and are negative. 13 point ROS  negative    Objective:   Physical Exam  Constitutional: He is oriented to person, place, and time. He appears well-developed and well-nourished.  HENT:  Head: Normocephalic and atraumatic.  Right Ear: External ear normal.  Left Ear: External ear normal.  Mouth/Throat: Oropharynx is clear and moist.  Eyes: Conjunctivae and EOM are normal. Pupils are equal, round, and reactive to light.  Neck: Normal range of motion. Neck supple. Carotid bruit is present (faint on the L). No thyromegaly present.  Cardiovascular: Normal rate, regular rhythm and intact distal pulses.  Murmur heard.  Systolic murmur is present with a grade of 2/6. Pulmonary/Chest: Effort normal and breath sounds normal. No respiratory distress. He has no wheezes.  Abdominal: Soft. He exhibits no distension. There is no tenderness. Hernia confirmed negative in the right inguinal area and confirmed negative in the left inguinal area.  Genitourinary: Prostate normal.  Musculoskeletal: Normal range of motion. He exhibits no edema or tenderness.  Lymphadenopathy:    He has no cervical adenopathy.  Neurological: He is alert and oriented to person, place, and time. He has normal reflexes.  Skin: Skin is warm and dry.  Psychiatric: He has a normal mood and affect. His behavior is normal.  Vitals reviewed.  Vitals:   03/21/17 1017  BP: 126/76  Pulse: 60  Resp: 18  Temp: 98.6 F (37 C)  TempSrc: Oral  SpO2: 97%  Weight: 204 lb 9.6 oz (92.8 kg)  Height: 6' (1.829 m)      Assessment & Plan:    Eric Ferrell. is a 76 y.o. male Medicare annual wellness visit, subsequent  -  - anticipatory guidance as below in AVS, screening labs if needed. Health maintenance items as above in HPI discussed/recommended as applicable.   - no concerning responses on depression, fall, or functional status screening. Any positive responses noted as above. Advanced directives discussed as in CHL.   Need for influenza vaccination - Plan: Flu  Vaccine QUAD 36+ mos IM, Zoster Vaccine Adjuvanted [facility]) injection  Bruit of left carotid artery - Plan: US Carotid Duplex Bilateral  - FH of CVA in mother. Check doppler to r/o stenosis. Asymptomatic at present.   Meds ordered this encounter  Medications  . Zoster Vaccine Adjuvanted [facility]) injection    Sig: Inject 0.5 mLs into the muscle once for 1 dose. Repeat in 2-6 months.    Dispense:  0.5 mL    Refill:  1   Patient Instructions   Shingles vaccines sent to your pharmacy. I do not see need for other blood work today. I will schedule an ultrasound of the left carotid for the possible noise in that area, but that may be related to the slight heart murmur. Let me know if you have any questions, otherwise follow-up in 1 year for physical. Thanks for coming in today.    Preventive Care 32 Years and Older, Male Preventive  care refers to lifestyle choices and visits with your health care provider that can promote health and wellness. What does preventive care include?  A yearly physical exam. This is also called an annual well check.  Dental exams once or twice a year.  Routine eye exams. Ask your health care provider how often you should have your eyes checked.  Personal lifestyle choices, including: ? Daily care of your teeth and gums. ? Regular physical activity. ? Eating a healthy diet. ? Avoiding tobacco and drug use. ? Limiting alcohol use. ? Practicing safe sex. ? Taking low doses of aspirin every day. ? Taking vitamin and mineral supplements as recommended by your health care provider. What happens during an annual well check? The services and screenings done by your health care provider during your annual well check will depend on your age, overall health, lifestyle risk factors, and family history of disease. Counseling Your health care provider may ask you questions about your:  Alcohol use.  Tobacco use.  Drug use.  Emotional well-being.  Home and  relationship well-being.  Sexual activity.  Eating habits.  History of falls.  Memory and ability to understand (cognition).  Work and work Statistician.  Screening You may have the following tests or measurements:  Height, weight, and BMI.  Blood pressure.  Lipid and cholesterol levels. These may be checked every 5 years, or more frequently if you are over 60 years old.  Skin check.  Lung cancer screening. You may have this screening every year starting at age 66 if you have a 30-pack-year history of smoking and currently smoke or have quit within the past 15 years.  Fecal occult blood test (FOBT) of the stool. You may have this test every year starting at age 76.  Flexible sigmoidoscopy or colonoscopy. You may have a sigmoidoscopy every 5 years or a colonoscopy every 10 years starting at age 88.  Prostate cancer screening. Recommendations will vary depending on your family history and other risks.  Hepatitis C blood test.  Hepatitis B blood test.  Sexually transmitted disease (STD) testing.  Diabetes screening. This is done by checking your blood sugar (glucose) after you have not eaten for a while (fasting). You may have this done every 1-3 years.  Abdominal aortic aneurysm (AAA) screening. You may need this if you are a current or former smoker.  Osteoporosis. You may be screened starting at age 62 if you are at high risk.  Talk with your health care provider about your test results, treatment options, and if necessary, the need for more tests. Vaccines Your health care provider may recommend certain vaccines, such as:  Influenza vaccine. This is recommended every year.  Tetanus, diphtheria, and acellular pertussis (Tdap, Td) vaccine. You may need a Td booster every 10 years.  Varicella vaccine. You may need this if you have not been vaccinated.  Zoster vaccine. You may need this after age 58.  Measles, mumps, and rubella (MMR) vaccine. You may need at least  one dose of MMR if you were born in 1957 or later. You may also need a second dose.  Pneumococcal 13-valent conjugate (PCV13) vaccine. One dose is recommended after age 60.  Pneumococcal polysaccharide (PPSV23) vaccine. One dose is recommended after age 78.  Meningococcal vaccine. You may need this if you have certain conditions.  Hepatitis A vaccine. You may need this if you have certain conditions or if you travel or work in places where you may be exposed to hepatitis A.  Hepatitis B vaccine. You may need this if you have certain conditions or if you travel or work in places where you may be exposed to hepatitis B.  Haemophilus influenzae type b (Hib) vaccine. You may need this if you have certain risk factors.  Talk to your health care provider about which screenings and vaccines you need and how often you need them. This information is not intended to replace advice given to you by your health care provider. Make sure you discuss any questions you have with your health care provider. Document Released: 04/21/2015 Document Revised: 12/13/2015 Document Reviewed: 01/24/2015 Elsevier Interactive Patient Education  2017 [employer].   IF you received an x-ray today, you will receive an invoice from [facility]. Please contact [facility] at [phone] with questions or concerns regarding your invoice.   IF you received labwork today, you will receive an invoice from [place]. Please contact [facility] at [phone] with questions or concerns regarding your invoice.   Our billing staff will not be able to assist you with questions regarding bills from these companies.  You will be contacted with the lab results as soon as they are available. The fastest way to get your results is to activate your My Chart account. Instructions are located on the last page of this paperwork. If you have not heard from [place] regarding the results in 2 weeks, please contact this office.        I personally performed the services described in this documentation, which was scribed in my presence. The recorded information has been reviewed and considered for accuracy and completeness, addended by me as needed, and agree with information above.  Signed,   [name], MD Primary [facility] at [place].  03/22/17 9:23 AM

## 2017-04-22 ENCOUNTER — Ambulatory Visit
Admission: RE | Admit: 2017-04-22 | Discharge: 2017-04-22 | Disposition: A | Discharge status: Home / Self Care | Payer: Medicare Other | Source: Ambulatory Visit | Attending: Family Medicine | Admitting: Family Medicine

## 2017-04-22 DIAGNOSIS — R0989 Other specified symptoms and signs involving the circulatory and respiratory systems: Secondary | ICD-10-CM

## 2017-06-14 ENCOUNTER — Encounter: Payer: Self-pay | Admitting: Internal Medicine

## 2017-10-14 ENCOUNTER — Encounter: Payer: Self-pay | Admitting: Internal Medicine

## 2017-12-15 ENCOUNTER — Ambulatory Visit (AMBULATORY_SURGERY_CENTER): Payer: Medicare Other

## 2017-12-15 VITALS — Ht 73.0 in | Wt 206.6 lb

## 2017-12-15 DIAGNOSIS — Z86010 Personal history of colonic polyps: Secondary | ICD-10-CM

## 2017-12-15 MED ORDER — PEG 3350-KCL-NA BICARB-NACL 420 G PO SOLR: 4000.0000 mL | Freq: Once | ORAL | 0 refills | Status: AC

## 2017-12-15 NOTE — Progress Notes (Signed)
No egg or soy allergy known to patient  No issues with past sedation with any surgeries  or procedures, no intubation problems  No diet pills per patient No home 02 use per patient  No blood thinners per patient  Pt denies issues with constipation  No A fib or A flutter  EMMI video sent to pt's e mail  Pt. declined 

## 2017-12-16 ENCOUNTER — Encounter: Payer: Self-pay | Admitting: Internal Medicine

## 2017-12-29 ENCOUNTER — Ambulatory Visit (AMBULATORY_SURGERY_CENTER): Payer: Medicare Other | Admitting: Internal Medicine

## 2017-12-29 ENCOUNTER — Encounter: Payer: Self-pay | Admitting: Internal Medicine

## 2017-12-29 VITALS — BP 132/63 | HR 60 | Temp 98.0°F | Resp 17 | Ht 73.0 in | Wt 206.0 lb

## 2017-12-29 DIAGNOSIS — Z860100 Personal history of colon polyps, unspecified: Secondary | ICD-10-CM

## 2017-12-29 DIAGNOSIS — D123 Benign neoplasm of transverse colon: Secondary | ICD-10-CM

## 2017-12-29 DIAGNOSIS — K635 Polyp of colon: Secondary | ICD-10-CM

## 2017-12-29 DIAGNOSIS — D125 Benign neoplasm of sigmoid colon: Secondary | ICD-10-CM | POA: Diagnosis not present

## 2017-12-29 DIAGNOSIS — D124 Benign neoplasm of descending colon: Secondary | ICD-10-CM | POA: Diagnosis not present

## 2017-12-29 MED ORDER — SODIUM CHLORIDE 0.9 % IV SOLN: 500.0000 mL | Freq: Once | INTRAVENOUS | Status: DC

## 2017-12-29 NOTE — Progress Notes (Signed)
Pt's states no medical or surgical changes since previsit or office visit. 

## 2017-12-29 NOTE — Op Note (Signed)
[place] Patient Name: Eric Ferrell Procedure Date: 12/29/2017 8:27 AM MRN: [id number] Endoscopist: Jerene Bears , MD Age: 77 Referring MD:  Date of Birth: [birth year] Gender: Male Account #: [id number] Procedure:                Colonoscopy Indications:              Surveillance: Personal history of adenomatous                            polyps on last colonoscopy 3 years ago Medicines:                Monitored Anesthesia Care Procedure:                Pre-Anesthesia Assessment:                           - Prior to the procedure, a History and Physical                            was performed, and patient medications and                            allergies were reviewed. The patient's tolerance of                            previous anesthesia was also reviewed. The risks                            and benefits of the procedure and the sedation                            options and risks were discussed with the patient.                            All questions were answered, and informed consent                            was obtained. Prior Anticoagulants: The patient has                            taken no previous anticoagulant or antiplatelet                            agents. ASA Grade Assessment: II - A patient with                            mild systemic disease. After reviewing the risks                            and benefits, the patient was deemed in                            satisfactory condition to undergo the procedure.  After obtaining informed consent, the colonoscope                            was passed under direct vision. Throughout the                            procedure, the patient's blood pressure, pulse, and                            oxygen saturations were monitored continuously. The                            Model CF-HQ190L [id number]) scope was introduced                            through the anus and advanced  to the cecum,                            identified by appendiceal orifice and ileocecal                            valve. The colonoscopy was performed without                            difficulty. The patient tolerated the procedure                            well. The quality of the bowel preparation was                            good. The ileocecal valve, appendiceal orifice, and                            rectum were photographed. Scope In: 8:42:51 AM Scope Out: 9:03:39 AM Scope Withdrawal Time: 0 hours 16 minutes 14 seconds  Total Procedure Duration: 0 hours 20 minutes 48 seconds  Findings:                 The digital rectal exam was normal.                           Three sessile polyps were found in the sigmoid                            colon, descending colon and splenic flexure. The                            polyps were 3 to 5 mm in size. These polyps were                            removed with a cold snare. Resection and retrieval                            were complete.  Internal hemorrhoids were found during                            retroflexion. The hemorrhoids were small. Complications:            No immediate complications. Estimated Blood Loss:     Estimated blood loss was minimal. Impression:               - Three 3 to 5 mm polyps in the sigmoid colon, in                            the descending colon and at the splenic flexure,                            removed with a cold snare. Resected and retrieved.                           - Internal hemorrhoids. Recommendation:           - Patient has a contact number available for                            emergencies. The signs and symptoms of potential                            delayed complications were discussed with the                            patient. Return to normal activities tomorrow.                            Written discharge instructions were provided to the                             patient.                           - Resume previous diet.                           - Continue present medications.                           - Await pathology results.                           - Repeat colonoscopy is recommended for                            surveillance. The colonoscopy date will be                            determined after pathology results from today's                            exam become available for review. Jerene Bears, MD 12/29/2017 9:07:28 AM This  report has been signed electronically.

## 2017-12-29 NOTE — Patient Instructions (Signed)
YOU HAD AN ENDOSCOPIC PROCEDURE TODAY AT [facility]:   Refer to the procedure report that was given to you for any specific questions about what was found during the examination.  If the procedure report does not answer your questions, please call your gastroenterologist to clarify.  If you requested that your care partner not be given the details of your procedure findings, then the procedure report has been included in a sealed envelope for you to review at your convenience later.  YOU SHOULD EXPECT: Some feelings of bloating in the abdomen. Passage of more gas than usual.  Walking can help get rid of the air that was put into your GI tract during the procedure and reduce the bloating. If you had a lower endoscopy (such as a colonoscopy or flexible sigmoidoscopy) you may notice spotting of blood in your stool or on the toilet paper. If you underwent a bowel prep for your procedure, you may not have a normal bowel movement for a few days.  Please Note:  You might notice some irritation and congestion in your nose or some drainage.  This is from the oxygen used during your procedure.  There is no need for concern and it should clear up in a day or so.  SYMPTOMS TO REPORT IMMEDIATELY:   Following lower endoscopy (colonoscopy or flexible sigmoidoscopy):  Excessive amounts of blood in the stool  Significant tenderness or worsening of abdominal pains  Swelling of the abdomen that is new, acute  Fever of 100F or higher  For urgent or emergent issues, a gastroenterologist can be reached at any hour by calling [phone].   DIET:  We do recommend a small meal at first, but then you may proceed to your regular diet.  Drink plenty of fluids but you should avoid alcoholic beverages for 24 hours.  ACTIVITY:  You should plan to take it easy for the rest of today and you should NOT DRIVE or use heavy machinery until tomorrow (because of the sedation medicines used during the test).     FOLLOW UP: Our staff will call the number listed on your records the next business day following your procedure to check on you and address any questions or concerns that you may have regarding the information given to you following your procedure. If we do not reach you, we will leave a message.  However, if you are feeling well and you are not experiencing any problems, there is no need to return our call.  We will assume that you have returned to your regular daily activities without incident.  If any biopsies were taken you will be contacted by phone or by letter within the next 1-3 weeks.  Please call us at [phone] if you have not heard about the biopsies in 3 weeks.   Await for biopsy results to determine next repeat screening colonoscopy Polyps (handout given) Hemorrhoids (handout given)    SIGNATURES/CONFIDENTIALITY: You and/or your care partner have signed paperwork which will be entered into your electronic medical record.  These signatures attest to the fact that that the information above on your After Visit Summary has been reviewed and is understood.  Full responsibility of the confidentiality of this discharge information lies with you and/or your care-partner.

## 2017-12-29 NOTE — Progress Notes (Signed)
To PACU, VSS. Report to [email or web address] 

## 2017-12-29 NOTE — Progress Notes (Signed)
Called to room to assist during endoscopic procedure.  Patient ID and intended procedure confirmed with present staff. Received instructions for my participation in the procedure from the performing physician.  

## 2017-12-30 ENCOUNTER — Telehealth: Payer: Self-pay

## 2017-12-30 NOTE — Telephone Encounter (Signed)
Left message on answering machine. 

## 2017-12-30 NOTE — Telephone Encounter (Signed)
  Follow up Call-  Call back number 12/29/2017  Post procedure Call Back phone  # [phone]  Permission to leave phone message Yes  Some recent data might be hidden     Patient questions:  Do you have a fever, pain , or abdominal swelling? No. Pain Score  0 *  Have you tolerated food without any problems? Yes.    Have you been able to return to your normal activities? Yes.    Do you have any questions about your discharge instructions: Diet   No. Medications  No. Follow up visit  No.  Do you have questions or concerns about your Care? No.  Actions: * If pain score is 4 or above: No action needed, pain <4.  No problems noted per pt.  He said he appreciated all that we did for him.  maw

## 2018-01-12 ENCOUNTER — Encounter: Payer: Self-pay | Admitting: Internal Medicine

## 2018-02-04 ENCOUNTER — Encounter (HOSPITAL_COMMUNITY): Payer: Self-pay | Admitting: Emergency Medicine

## 2018-02-04 ENCOUNTER — Other Ambulatory Visit: Payer: Self-pay

## 2018-02-04 ENCOUNTER — Emergency Department (HOSPITAL_COMMUNITY)
Admission: EM | Admit: 2018-02-04 | Discharge: 2018-02-04 | Disposition: A | Discharge status: Home / Self Care | Payer: Medicare Other | Attending: Emergency Medicine | Admitting: Emergency Medicine

## 2018-02-04 DIAGNOSIS — X500XXA Overexertion from strenuous movement or load, initial encounter: Secondary | ICD-10-CM | POA: Diagnosis not present

## 2018-02-04 DIAGNOSIS — S76011A Strain of muscle, fascia and tendon of right hip, initial encounter: Secondary | ICD-10-CM | POA: Insufficient documentation

## 2018-02-04 DIAGNOSIS — Y998 Other external cause status: Secondary | ICD-10-CM | POA: Diagnosis not present

## 2018-02-04 DIAGNOSIS — Y9289 Other specified places as the place of occurrence of the external cause: Secondary | ICD-10-CM | POA: Diagnosis not present

## 2018-02-04 DIAGNOSIS — S79911A Unspecified injury of right hip, initial encounter: Secondary | ICD-10-CM | POA: Diagnosis present

## 2018-02-04 DIAGNOSIS — Z7982 Long term (current) use of aspirin: Secondary | ICD-10-CM | POA: Insufficient documentation

## 2018-02-04 DIAGNOSIS — Y9389 Activity, other specified: Secondary | ICD-10-CM | POA: Diagnosis not present

## 2018-02-04 LAB — COMPREHENSIVE METABOLIC PANEL
ALT: 19 U/L (ref 0–44)
ANION GAP: 7 (ref 5–15)
AST: 20 U/L (ref 15–41)
Albumin: 4.1 g/dL (ref 3.5–5.0)
Alkaline Phosphatase: 59 U/L (ref 38–126)
BILIRUBIN TOTAL: 0.4 mg/dL (ref 0.3–1.2)
BUN: 23 mg/dL (ref 8–23)
CO2: 29 mmol/L (ref 22–32)
Calcium: 9.6 mg/dL (ref 8.9–10.3)
Chloride: 102 mmol/L (ref 98–111)
Creatinine, Ser: 0.91 mg/dL (ref 0.61–1.24)
GFR calc Af Amer: >60 mL/min (ref >60)
Glucose, Bld: 113 mg/dL — ABNORMAL HIGH (ref 70–99)
POTASSIUM: 3.8 mmol/L (ref 3.5–5.1)
Sodium: 138 mmol/L (ref 135–145)
TOTAL PROTEIN: 8.2 g/dL — AB (ref 6.5–8.1)

## 2018-02-04 LAB — CBC WITH DIFFERENTIAL/PLATELET
Abs Immature Granulocytes: 0.01 10*3/uL (ref 0.00–0.07)
BASOS ABS: 0 10*3/uL (ref 0.0–0.1)
BASOS PCT: 0 %
EOS ABS: 0.1 10*3/uL (ref 0.0–0.5)
Eosinophils Relative: 2 %
HEMATOCRIT: 42.8 % (ref 39.0–52.0)
Hemoglobin: 13.7 g/dL (ref 13.0–17.0)
Immature Granulocytes: 0 %
LYMPHS ABS: 1.4 10*3/uL (ref 0.7–4.0)
Lymphocytes Relative: 27 %
MCH: 28.2 pg (ref 26.0–34.0)
MCHC: 32 g/dL (ref 30.0–36.0)
MCV: 88.1 fL (ref 80.0–100.0)
MONOS PCT: 15 %
Monocytes Absolute: 0.8 10*3/uL (ref 0.1–1.0)
NEUTROS PCT: 56 %
NRBC: 0 % (ref 0.0–0.2)
Neutro Abs: 2.8 10*3/uL (ref 1.7–7.7)
Platelets: 292 10*3/uL (ref 150–400)
RBC: 4.86 MIL/uL (ref 4.22–5.81)
RDW: 12.3 % (ref 11.5–15.5)
WBC: 5 10*3/uL (ref 4.0–10.5)

## 2018-02-04 LAB — URINALYSIS, ROUTINE W REFLEX MICROSCOPIC
BILIRUBIN URINE: NEGATIVE
Glucose, UA: NEGATIVE mg/dL
Hgb urine dipstick: NEGATIVE
KETONES UR: NEGATIVE mg/dL
LEUKOCYTES UA: NEGATIVE
Nitrite: NEGATIVE
PH: 5 (ref 5.0–8.0)
Protein, ur: NEGATIVE mg/dL
Specific Gravity, Urine: 1.029 (ref 1.005–1.030)

## 2018-02-04 LAB — CK: CK TOTAL: 113 U/L (ref 49–397)

## 2018-02-04 MED ORDER — SODIUM CHLORIDE 0.9 % IV BOLUS
1000.0000 mL | Freq: Once | INTRAVENOUS | Status: AC
Start: 2018-02-04 — End: 2018-02-04
  Administered 2018-02-04: 1000 mL via INTRAVENOUS

## 2018-02-04 MED ORDER — CYCLOBENZAPRINE HCL 10 MG PO TABS
5.0000 mg | ORAL_TABLET | Freq: Once | ORAL | Status: AC
  Administered 2018-02-04: 5 mg via ORAL
  Filled 2018-02-04: qty 1

## 2018-02-04 MED ORDER — CYCLOBENZAPRINE HCL 5 MG PO TABS: 5.0000 mg | ORAL_TABLET | Freq: Three times a day (TID) | ORAL | 0 refills | Status: DC | PRN

## 2018-02-04 NOTE — Discharge Instructions (Signed)
Take motrin for pain.   Take flexeril for muscle spasms.   See your doctor   Return to ER if you have worse hip pain, muscle pain, trouble walking

## 2018-02-04 NOTE — ED Provider Notes (Signed)
[facility] DEPT Provider Note   CSN: [id number] Arrival date & time: 02/04/18  1814     History   Chief Complaint Chief Complaint  Patient presents with  . Fall  . Leg Pain  . Hip Pain    HPI Eric Ferrell. is a 77 y.o. male otherwise healthy here presenting with right hip pain.  Patient states that he was working during [employer] home coming weekend.  He states that he was wearing a lot and was very hot outside.  He states that he passed out 3 days ago and EMT came to check him out.  His vitals were stable at that time and patient was not brought to the ER.  He was thought to have some heat exhaustion.  He did fall onto his hip at that time.  He states that he noticed that his right hip and thigh it is sore.  He states that it is worse when he sits there for long periods of time but improves when she walks around.  Is able to jog around today but just noticed increasing soreness.  Denies any fevers or chills or vomiting.  Denies any abdominal pain.  Denies chest pain or any further episodes of syncope.  The history is provided by the patient.    Past Medical History:  Diagnosis Date  . Heart murmur   . Nervous stomach     There are no active problems to display for this patient.   Past Surgical History:  Procedure Laterality Date  . COLONOSCOPY    . CYSTECTOMY     left leg  . POLYPECTOMY          Home Medications    Prior to Admission medications   Medication Sig Start Date End Date Taking? Authorizing Provider  aspirin 81 MG tablet Take 162 mg by mouth daily.     [provider]  Multiple Vitamins-Minerals (MULTIVITAMIN WITH MINERALS) tablet Take 1 tablet by mouth daily.    [provider]    Family History Family History  Problem Relation Age of Onset  . Stroke Mother   . Diabetes Mother   . Healthy Father   . Diabetes Sister   . Healthy Brother   . Healthy Maternal Grandmother   . Healthy Maternal  Grandfather   . Healthy Paternal Grandmother   . Healthy Paternal Grandfather   . Healthy Brother   . Healthy Sister   . Colon cancer Neg Hx   . Esophageal cancer Neg Hx   . Prostate cancer Neg Hx   . Rectal cancer Neg Hx   . Stomach cancer Neg Hx     Social History Social History   Tobacco Use  . Smoking status: Former Smoker    Years: 5.00    Types: Cigarettes  . Smokeless tobacco: Never Used  . Tobacco comment: in college  Substance Use Topics  . Alcohol use: No    Alcohol/week: 0.0 standard drinks  . Drug use: No     Allergies   Patient has no known allergies.   Review of Systems Review of Systems  Musculoskeletal:       R hip pain   All other systems reviewed and are negative.    Physical Exam Updated Vital Signs BP (!) 141/66 (BP Location: Right Arm)   Pulse 61   Temp 98.6 F (37 C) (Oral)   Resp 16   SpO2 100%   Physical Exam  Constitutional: He is oriented to person,  place, and time. He appears well-developed and well-nourished.  HENT:  Head: Normocephalic.  Mouth/Throat: Oropharynx is clear and moist.  Eyes: Pupils are equal, round, and reactive to light. Conjunctivae and EOM are normal.  Neck: Normal range of motion. Neck supple.  Cardiovascular: Normal rate, regular rhythm and normal heart sounds.  Pulmonary/Chest: Effort normal and breath sounds normal. No stridor. No respiratory distress.  Abdominal: Soft. Bowel sounds are normal. He exhibits no distension.  Musculoskeletal:  Mild R thigh tenderness. Nl ROM R hip, no spinal tenderness. No obvious bony deformity. Neurovascular intact bilateral lower extremities   Neurological: He is alert and oriented to person, place, and time.  Skin: Skin is warm. Capillary refill takes less than 2 seconds.  Psychiatric: He has a normal mood and affect.  Nursing note and vitals reviewed.    ED Treatments / Results  Labs (all labs ordered are listed, but only abnormal results are displayed) Labs  Reviewed  COMPREHENSIVE METABOLIC PANEL - Abnormal; Notable for the following components:      Result Value   Glucose, Bld 113 (*)    Total Protein 8.2 (*)    All other components within normal limits  URINALYSIS, ROUTINE W REFLEX MICROSCOPIC - Abnormal; Notable for the following components:   APPearance HAZY (*)    All other components within normal limits  CBC WITH DIFFERENTIAL/PLATELET  CK    EKG None  Radiology No results found.  Procedures Procedures (including critical care time)  Medications Ordered in ED Medications  sodium chloride 0.9 % bolus 1,000 mL (1,000 mLs Intravenous New Bag/Given 02/04/18 2023)  cyclobenzaprine (FLEXERIL) tablet 5 mg (5 mg Oral Given 02/04/18 2127)     Initial Impression / Assessment and Plan / ED Course  I have reviewed the triage vital signs and the nursing notes.  Pertinent labs & imaging results that were available during my care of the patient were reviewed by me and considered in my medical decision making (see chart for details).    Eric Ferrell. is a 77 y.o. male here with R hip pain after fall. Consider rhabdo vs muscle strain. He is ambulating and running so I doubt occult fracture. Will get labs, CK. Will get orthostatics, give muscle relaxants and reassess.   9:53 PM Labs and CK unremarkable. Felt better with flexeril. Stable for discharge.     Final Clinical Impressions(s) / ED Diagnoses   Final diagnoses:  None    ED Discharge Orders    None       Drenda Freeze, MD 02/04/18 2158

## 2018-02-04 NOTE — ED Triage Notes (Signed)
Pt reports he was working at [employer] Saturday and he past out due to heat exhaustion and fell. Pt reports since last night having right hip to right upper leg pains esp when going from sitting position to standing. Reports when walking around has no pains.

## 2018-04-15 ENCOUNTER — Encounter: Payer: Self-pay | Admitting: Family Medicine

## 2018-04-15 ENCOUNTER — Ambulatory Visit (INDEPENDENT_AMBULATORY_CARE_PROVIDER_SITE_OTHER): Payer: Medicare Other | Admitting: Family Medicine

## 2018-04-15 ENCOUNTER — Other Ambulatory Visit: Payer: Self-pay

## 2018-04-15 VITALS — BP 127/58 | HR 56 | Temp 98.5°F | Resp 16 | Ht 73.0 in | Wt 203.2 lb

## 2018-04-15 DIAGNOSIS — R739 Hyperglycemia, unspecified: Secondary | ICD-10-CM

## 2018-04-15 DIAGNOSIS — Z23 Encounter for immunization: Secondary | ICD-10-CM

## 2018-04-15 DIAGNOSIS — Z0001 Encounter for general adult medical examination with abnormal findings: Secondary | ICD-10-CM | POA: Diagnosis not present

## 2018-04-15 DIAGNOSIS — Z125 Encounter for screening for malignant neoplasm of prostate: Secondary | ICD-10-CM

## 2018-04-15 DIAGNOSIS — Z0000 Encounter for general adult medical examination without abnormal findings: Secondary | ICD-10-CM

## 2018-04-15 NOTE — Patient Instructions (Addendum)
Thanks for coming in today.  I will check some blood work as we discussed.   Preventive Care 78 Years and Older, Male Preventive care refers to lifestyle choices and visits with your health care provider that can promote health and wellness. What does preventive care include?   A yearly physical exam. This is also called an annual well check.  Dental exams once or twice a year.  Routine eye exams. Ask your health care provider how often you should have your eyes checked.  Personal lifestyle choices, including: ? Daily care of your teeth and gums. ? Regular physical activity. ? Eating a healthy diet. ? Avoiding tobacco and drug use. ? Limiting alcohol use. ? Practicing safe sex. ? Taking low doses of aspirin every day. ? Taking vitamin and mineral supplements as recommended by your health care provider. What happens during an annual well check? The services and screenings done by your health care provider during your annual well check will depend on your age, overall health, lifestyle risk factors, and family history of disease. Counseling Your health care provider may ask you questions about your:  Alcohol use.  Tobacco use.  Drug use.  Emotional well-being.  Home and relationship well-being.  Sexual activity.  Eating habits.  History of falls.  Memory and ability to understand (cognition).  Work and work Statistician. Screening You may have the following tests or measurements:  Height, weight, and BMI.  Blood pressure.  Lipid and cholesterol levels. These may be checked every 5 years, or more frequently if you are over 58 years old.  Skin check.  Lung cancer screening. You may have this screening every year starting at age 32 if you have a 30-pack-year history of smoking and currently smoke or have quit within the past 15 years.  Colorectal cancer screening. All adults should have this screening starting at age 75 and continuing until age 23. You will have  tests every 1-10 years, depending on your results and the type of screening test. People at increased risk should start screening at an earlier age. Screening tests may include: ? Guaiac-based fecal occult blood testing. ? Fecal immunochemical test (FIT). ? Stool DNA test. ? Virtual colonoscopy. ? Sigmoidoscopy. During this test, a flexible tube with a tiny camera (sigmoidoscope) is used to examine your rectum and lower colon. The sigmoidoscope is inserted through your anus into your rectum and lower colon. ? Colonoscopy. During this test, a long, thin, flexible tube with a tiny camera (colonoscope) is used to examine your entire colon and rectum.  Prostate cancer screening. Recommendations will vary depending on your family history and other risks.  Hepatitis C blood test.  Hepatitis B blood test.  Sexually transmitted disease (STD) testing.  Diabetes screening. This is done by checking your blood sugar (glucose) after you have not eaten for a while (fasting). You may have this done every 1-3 years.  Abdominal aortic aneurysm (AAA) screening. You may need this if you are a current or former smoker.  Osteoporosis. You may be screened starting at age 62 if you are at high risk. Talk with your health care provider about your test results, treatment options, and if necessary, the need for more tests. Vaccines Your health care provider may recommend certain vaccines, such as:  Influenza vaccine. This is recommended every year.  Tetanus, diphtheria, and acellular pertussis (Tdap, Td) vaccine. You may need a Td booster every 10 years.  Varicella vaccine. You may need this if you have not been vaccinated.  Zoster vaccine. You may need this after age 39.  Measles, mumps, and rubella (MMR) vaccine. You may need at least one dose of MMR if you were born in 1957 or later. You may also need a second dose.  Pneumococcal 13-valent conjugate (PCV13) vaccine. One dose is recommended after age  83.  Pneumococcal polysaccharide (PPSV23) vaccine. One dose is recommended after age 14.  Meningococcal vaccine. You may need this if you have certain conditions.  Hepatitis A vaccine. You may need this if you have certain conditions or if you travel or work in places where you may be exposed to hepatitis A.  Hepatitis B vaccine. You may need this if you have certain conditions or if you travel or work in places where you may be exposed to hepatitis B.  Haemophilus influenzae type b (Hib) vaccine. You may need this if you have certain risk factors. Talk to your health care provider about which screenings and vaccines you need and how often you need them. This information is not intended to replace advice given to you by your health care provider. Make sure you discuss any questions you have with your health care provider. Document Released: 04/21/2015 Document Revised: 05/15/2017 Document Reviewed: 01/24/2015 Elsevier Interactive Patient Education  [employer].     If you have lab work done today you will be contacted with your lab results within the next 2 weeks.  If you have not heard from [place] then please contact us. The fastest way to get your results is to register for My Chart.   IF you received an x-ray today, you will receive an invoice from [facility]. Please contact [facility] at [phone] with questions or concerns regarding your invoice.   IF you received labwork today, you will receive an invoice from [facility]. Please contact [facility] at [phone] with questions or concerns regarding your invoice.   Our billing staff will not be able to assist you with questions regarding bills from these companies.  You will be contacted with the lab results as soon as they are available. The fastest way to get your results is to activate your My Chart account. Instructions are located on the last page of this paperwork. If you have not heard from [place] regarding  the results in 2 weeks, please contact this office.

## 2018-04-15 NOTE — Progress Notes (Signed)
Subjective:    Patient ID: Eric Noble., male    DOB: 1941-01-24, 78 y.o.   MRN: [id number]  HPI Eric Phil Michels. is a 78 y.o. male Presents today for: Chief Complaint  Patient presents with  . Medicare Wellness    not having any issues at this time   No acute concerns.   Cancer screening:  Colonoscopy 12/29/2017. PSA 1.3 in December 2017.  Would like to test today after discussion.    Immunization History  Administered Date(s) Administered  . Influenza, High Dose Seasonal PF 04/15/2018  . Influenza,inj,Quad PF,6+ Mos 03/13/2015, 01/11/2016, 03/21/2017  . Influenza-Unspecified 04/08/2013  . Pneumococcal Conjugate-13 03/13/2015  . Pneumococcal Polysaccharide-23 04/11/2013  . Tdap 03/13/2015  . Zoster 04/08/2014   Fall screen: 1 fall in the past year.  Working at [employer]. Overworked, suffered some heat illness. Staggered. May have had some near syncope. No injuries. Improved after cooling down. No recurrence since.   Depression screen [facility] 2/9 04/15/2018 03/21/2017 03/14/2016 01/11/2016 03/13/2015  Decreased Interest 0 0 0 0 0  Down, Depressed, Hopeless 0 0 0 0 0  PHQ - 2 Score 0 0 0 0 0   Functional Status Survey: Is the patient deaf or have difficulty hearing?: No Does the patient have difficulty seeing, even when wearing glasses/contacts?: No Does the patient have difficulty concentrating, remembering, or making decisions?: No Does the patient have difficulty walking or climbing stairs?: No Does the patient have difficulty dressing or bathing?: No Does the patient have difficulty doing errands alone such as visiting a doctor's office or shopping?: No  6CIT Screen 04/15/2018 03/21/2017  What Year? 0 points 0 points  What month? 0 points 0 points  What time? 0 points 0 points  Count back from 20 0 points 0 points  Months in reverse 0 points 0 points  Repeat phrase 0 points 0 points  Total Score 0 0     Visual Acuity Screening   Right eye Left eye Both eyes  Without  correction: 20/20 20/20 20/20   With correction:      Dental: appt next week., Q 52month.   Exercise: active job. Over 6000 steps per day, occasional gym. 2 jobs.   Advanced directives: Has not completed yet.   There are no active problems to display for this patient.  Past Medical History:  Diagnosis Date  . Heart murmur   . Nervous stomach    Past Surgical History:  Procedure Laterality Date  . COLONOSCOPY    . CYSTECTOMY     left leg  . POLYPECTOMY     No Known Allergies Prior to Admission medications   Medication Sig Start Date End Date Taking? Authorizing Provider  aspirin 81 MG tablet Take 162 mg by mouth daily.    Yes [provider]  cyclobenzaprine (FLEXERIL) 5 MG tablet Take 1 tablet (5 mg total) by mouth 3 (three) times daily as needed for muscle spasms. 02/04/18  Yes YDrenda Freeze MD  Multiple Vitamins-Minerals (MULTIVITAMIN WITH MINERALS) tablet Take 1 tablet by mouth daily.   Yes [provider]   Social History   Socioeconomic History  . Marital status: Married    Spouse name: Not on file  . Number of children: Not on file  . Years of education: Not on file  . Highest education level: Not on file  Occupational History  . 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 Financial resource strain: Not on file  . Food insecurity:  Worry: Not on file    Inability: Not on file  . Transportation needs:    Medical: Not on file    Non-medical: Not on file  Tobacco Use  . Smoking status: Former Smoker    Years: 5.00    Types: Cigarettes  . Smokeless tobacco: Never Used  . Tobacco comment: in college  Substance and Sexual Activity  . Alcohol use: No    Alcohol/week: 0.0 standard drinks  . Drug use: No  . Sexual activity: Yes  Lifestyle  . Physical activity:    Days per week: Not on file    Minutes per session: Not on file  . Stress: Not on file  Relationships  . Social connections:    Talks on phone: Not on file     Gets together: Not on file    Attends religious service: Not on file    Active member of club or organization: Not on file    Attends meetings of clubs or organizations: Not on file    Relationship status: Not on file  . Intimate partner violence:    Fear of current or ex partner: Not on file    Emotionally abused: Not on file    Physically abused: Not on file    Forced sexual activity: Not on file  Other Topics Concern  . Not on file  Social History Narrative   Married.   Education: College   Exercise: Yes    Review of Systems 13 point review of systems per patient health survey noted.  Negative other than as indicated above or in HPI.      Objective:   Physical Exam Vitals signs reviewed.  Constitutional:      Appearance: He is well-developed.  HENT:     Head: Normocephalic and atraumatic.     Right Ear: External ear normal.     Left Ear: External ear normal.  Eyes:     Conjunctiva/sclera: Conjunctivae normal.     Pupils: Pupils are equal, round, and reactive to light.  Neck:     Musculoskeletal: Normal range of motion and neck supple.     Thyroid: No thyromegaly.  Cardiovascular:     Rate and Rhythm: Normal rate and regular rhythm.     Heart sounds: Normal heart sounds.  Pulmonary:     Effort: Pulmonary effort is normal. No respiratory distress.     Breath sounds: Normal breath sounds. No wheezing.  Abdominal:     General: There is no distension.     Palpations: Abdomen is soft.     Tenderness: There is no abdominal tenderness.     Hernia: There is no hernia in the right inguinal area or left inguinal area.  Genitourinary:    Prostate: Normal.  Musculoskeletal: Normal range of motion.        General: No tenderness.  Lymphadenopathy:     Cervical: No cervical adenopathy.  Skin:    General: Skin is warm and dry.  Neurological:     Mental Status: He is alert and oriented to person, place, and time.     Deep Tendon Reflexes: Reflexes are normal and symmetric.    Psychiatric:        Behavior: Behavior normal.    Vitals:   04/15/18 1336  BP: (!) 127/58  Pulse: (!) 56  Resp: 16  Temp: 98.5 F (36.9 C)  TempSrc: Oral  SpO2: 97%  Weight: 203 lb 3.2 oz (92.2 kg)  Height: 6' 1"  (1.854 m)  Assessment & Plan:   Eric Kina. is a 78 y.o. male Medicare annual wellness visit, subsequent  - - anticipatory guidance as below in AVS, screening labs if needed. Health maintenance items as above in HPI discussed/recommended as applicable.  - no concerning responses on depression, fall, or functional status screening. Any positive responses noted as above. Advanced directives discussed as in CH[facility].   Need for influenza vaccination - Plan: Flu vaccine HIGH DOSE PF (Fluzone High dose)  Hyperglycemia - Plan: Comprehensive metabolic panel, Hemoglobin A1c  - screen for diabetes.  Continue activity/exercise, watch diet.  Screening for prostate cancer - Plan: PSA  - We discussed pros and cons of prostate cancer screening, and after this discussion, he chose to have screening done. PSA obtained, and no concerning findings on DRE.    No orders of the defined types were placed in this encounter.  Patient Instructions   Thanks for coming in today.  I will check some blood work as we discussed.   Preventive Care 30 Years and Older, Male Preventive care refers to lifestyle choices and visits with your health care provider that can promote health and wellness. What does preventive care include?   A yearly physical exam. This is also called an annual well check.  Dental exams once or twice a year.  Routine eye exams. Ask your health care provider how often you should have your eyes checked.  Personal lifestyle choices, including: ? Daily care of your teeth and gums. ? Regular physical activity. ? Eating a healthy diet. ? Avoiding tobacco and drug use. ? Limiting alcohol use. ? Practicing safe sex. ? Taking low doses of aspirin every  day. ? Taking vitamin and mineral supplements as recommended by your health care provider. What happens during an annual well check? The services and screenings done by your health care provider during your annual well check will depend on your age, overall health, lifestyle risk factors, and family history of disease. Counseling Your health care provider may ask you questions about your:  Alcohol use.  Tobacco use.  Drug use.  Emotional well-being.  Home and relationship well-being.  Sexual activity.  Eating habits.  History of falls.  Memory and ability to understand (cognition).  Work and work Statistician. Screening You may have the following tests or measurements:  Height, weight, and BMI.  Blood pressure.  Lipid and cholesterol levels. These may be checked every 5 years, or more frequently if you are over 42 years old.  Skin check.  Lung cancer screening. You may have this screening every year starting at age 11 if you have a 30-pack-year history of smoking and currently smoke or have quit within the past 15 years.  Colorectal cancer screening. All adults should have this screening starting at age 68 and continuing until age 60. You will have tests every 1-10 years, depending on your results and the type of screening test. People at increased risk should start screening at an earlier age. Screening tests may include: ? Guaiac-based fecal occult blood testing. ? Fecal immunochemical test (FIT). ? Stool DNA test. ? Virtual colonoscopy. ? Sigmoidoscopy. During this test, a flexible tube with a tiny camera (sigmoidoscope) is used to examine your rectum and lower colon. The sigmoidoscope is inserted through your anus into your rectum and lower colon. ? Colonoscopy. During this test, a long, thin, flexible tube with a tiny camera (colonoscope) is used to examine your entire colon and rectum.  Prostate cancer screening. Recommendations will vary depending  on your family  history and other risks.  Hepatitis C blood test.  Hepatitis B blood test.  Sexually transmitted disease (STD) testing.  Diabetes screening. This is done by checking your blood sugar (glucose) after you have not eaten for a while (fasting). You may have this done every 1-3 years.  Abdominal aortic aneurysm (AAA) screening. You may need this if you are a current or former smoker.  Osteoporosis. You may be screened starting at age 84 if you are at high risk. Talk with your health care provider about your test results, treatment options, and if necessary, the need for more tests. Vaccines Your health care provider may recommend certain vaccines, such as:  Influenza vaccine. This is recommended every year.  Tetanus, diphtheria, and acellular pertussis (Tdap, Td) vaccine. You may need a Td booster every 10 years.  Varicella vaccine. You may need this if you have not been vaccinated.  Zoster vaccine. You may need this after age 89.  Measles, mumps, and rubella (MMR) vaccine. You may need at least one dose of MMR if you were born in 1957 or later. You may also need a second dose.  Pneumococcal 13-valent conjugate (PCV13) vaccine. One dose is recommended after age 27.  Pneumococcal polysaccharide (PPSV23) vaccine. One dose is recommended after age 84.  Meningococcal vaccine. You may need this if you have certain conditions.  Hepatitis A vaccine. You may need this if you have certain conditions or if you travel or work in places where you may be exposed to hepatitis A.  Hepatitis B vaccine. You may need this if you have certain conditions or if you travel or work in places where you may be exposed to hepatitis B.  Haemophilus influenzae type b (Hib) vaccine. You may need this if you have certain risk factors. Talk to your health care provider about which screenings and vaccines you need and how often you need them. This information is not intended to replace advice given to you by your  health care provider. Make sure you discuss any questions you have with your health care provider. Document Released: 04/21/2015 Document Revised: 05/15/2017 Document Reviewed: 01/24/2015 Elsevier Interactive Patient Education  [employer].     If you have lab work done today you will be contacted with your lab results within the next 2 weeks.  If you have not heard from [place] then please contact us. The fastest way to get your results is to register for My Chart.   IF you received an x-ray today, you will receive an invoice from [facility]. Please contact [facility] at [phone] with questions or concerns regarding your invoice.   IF you received labwork today, you will receive an invoice from [facility]. Please contact [facility] at [phone] with questions or concerns regarding your invoice.   Our billing staff will not be able to assist you with questions regarding bills from these companies.  You will be contacted with the lab results as soon as they are available. The fastest way to get your results is to activate your My Chart account. Instructions are located on the last page of this paperwork. If you have not heard from [place] regarding the results in 2 weeks, please contact this office.       Signed,   [name], MD [facility].  04/18/18 3:06 PM

## 2018-04-16 LAB — COMPREHENSIVE METABOLIC PANEL
ALBUMIN: 4.4 g/dL (ref 3.5–4.8)
ALT: 14 IU/L (ref 0–44)
AST: 16 IU/L (ref 0–40)
Albumin/Globulin Ratio: 1.5 (ref 1.2–2.2)
Alkaline Phosphatase: 68 IU/L (ref 39–117)
BILIRUBIN TOTAL: 0.4 mg/dL (ref 0.0–1.2)
BUN / CREAT RATIO: 10 (ref 10–24)
BUN: 9 mg/dL (ref 8–27)
CHLORIDE: 98 mmol/L (ref 96–106)
CO2: 26 mmol/L (ref 20–29)
Calcium: 10.1 mg/dL (ref 8.6–10.2)
Creatinine, Ser: 0.91 mg/dL (ref 0.76–1.27)
GFR calc Af Amer: 94 mL/min/{1.73_m2} (ref >59)
GFR calc non Af Amer: 81 mL/min/{1.73_m2} (ref >59)
GLUCOSE: 88 mg/dL (ref 65–99)
Globulin, Total: 3 g/dL (ref 1.5–4.5)
Potassium: 4.9 mmol/L (ref 3.5–5.2)
SODIUM: 138 mmol/L (ref 134–144)
Total Protein: 7.4 g/dL (ref 6.0–8.5)

## 2018-04-16 LAB — PSA: Prostate Specific Ag, Serum: 1.1 ng/mL (ref 0.0–4.0)

## 2018-04-16 LAB — HEMOGLOBIN A1C
Est. average glucose Bld gHb Est-mCnc: 120 mg/dL
Hgb A1c MFr Bld: 5.8 % — ABNORMAL HIGH (ref 4.8–5.6)

## 2018-04-18 ENCOUNTER — Encounter: Payer: Self-pay | Admitting: Family Medicine

## 2018-12-24 ENCOUNTER — Encounter: Payer: Self-pay | Admitting: Family Medicine

## 2019-04-22 ENCOUNTER — Ambulatory Visit (INDEPENDENT_AMBULATORY_CARE_PROVIDER_SITE_OTHER): Payer: Medicare PPO | Admitting: Family Medicine

## 2019-04-22 ENCOUNTER — Other Ambulatory Visit: Payer: Self-pay

## 2019-04-22 ENCOUNTER — Ambulatory Visit (INDEPENDENT_AMBULATORY_CARE_PROVIDER_SITE_OTHER): Payer: Medicare PPO

## 2019-04-22 VITALS — BP 140/76 | HR 66 | Temp 98.0°F | Ht 73.0 in | Wt 204.2 lb

## 2019-04-22 DIAGNOSIS — R918 Other nonspecific abnormal finding of lung field: Secondary | ICD-10-CM

## 2019-04-22 DIAGNOSIS — R7303 Prediabetes: Secondary | ICD-10-CM | POA: Diagnosis not present

## 2019-04-22 DIAGNOSIS — Z23 Encounter for immunization: Secondary | ICD-10-CM | POA: Diagnosis not present

## 2019-04-22 DIAGNOSIS — Z0001 Encounter for general adult medical examination with abnormal findings: Secondary | ICD-10-CM

## 2019-04-22 DIAGNOSIS — Z0000 Encounter for general adult medical examination without abnormal findings: Secondary | ICD-10-CM

## 2019-04-22 LAB — COMPREHENSIVE METABOLIC PANEL
ALT: 13 IU/L (ref 0–44)
AST: 19 IU/L (ref 0–40)
Albumin/Globulin Ratio: 1.6 (ref 1.2–2.2)
Albumin: 4.6 g/dL (ref 3.7–4.7)
Alkaline Phosphatase: 76 IU/L (ref 39–117)
BUN/Creatinine Ratio: 16 (ref 10–24)
BUN: 15 mg/dL (ref 8–27)
Bilirubin Total: 0.3 mg/dL (ref 0.0–1.2)
CO2: 25 mmol/L (ref 20–29)
Calcium: 9.9 mg/dL (ref 8.6–10.2)
Chloride: 100 mmol/L (ref 96–106)
Creatinine, Ser: 0.91 mg/dL (ref 0.76–1.27)
GFR calc Af Amer: 93 mL/min/{1.73_m2} (ref >59)
GFR calc non Af Amer: 80 mL/min/{1.73_m2} (ref >59)
Globulin, Total: 2.9 g/dL (ref 1.5–4.5)
Glucose: 86 mg/dL (ref 65–99)
Potassium: 4.8 mmol/L (ref 3.5–5.2)
Sodium: 140 mmol/L (ref 134–144)
Total Protein: 7.5 g/dL (ref 6.0–8.5)

## 2019-04-22 LAB — HEMOGLOBIN A1C
Est. average glucose Bld gHb Est-mCnc: 114 mg/dL
Hgb A1c MFr Bld: 5.6 % (ref 4.8–5.6)

## 2019-04-22 NOTE — Progress Notes (Signed)
Subjective:  Patient ID: Eric Noble., male    DOB: Apr 26, 1940  Age: 79 y.o. MRN: [id number]  CC:  Chief Complaint  Patient presents with  . Annual Exam    Pt feels good with no complants. pt has no concers at this time per pt.    HPI Eric Ferrell. presents for   Annual wellness exam. No acute concerns.   Care team:PCP - me.  Dentist: Lovena Le optho:[facility] No other specialists.   Not working for now with pandemic  Cancer screening: Colonoscopy December 29, 2017, repeat 3 years for precancerous polyps.  Prostate: Normal PSA at 1.1 in January 2020. Normal prior. Deferred today.   Immunization History  Administered Date(s) Administered  . Influenza, High Dose Seasonal PF 04/15/2018  . Influenza,inj,Quad PF,6+ Mos 03/13/2015, 01/11/2016, 03/21/2017  . Influenza-Unspecified 04/08/2013  . Pneumococcal Conjugate-13 03/13/2015  . Pneumococcal Polysaccharide-23 04/11/2013  . Tdap 03/13/2015  . Zoster 04/08/2014  . Zoster Recombinat (Shingrix) 10/15/2017, 04/09/2018    Fall screening: No falls in the past year. Loose rugs none, adequate lighting in home Stairs few outside.   Depression screen [facility] 2/9 04/22/2019 04/15/2018 03/21/2017 03/14/2016 01/11/2016  Decreased Interest 0 0 0 0 0  Down, Depressed, Hopeless 0 0 0 0 0  PHQ - 2 Score 0 0 0 0 0   Functional Status Survey: Is the patient deaf or have difficulty hearing?: No Does the patient have difficulty seeing, even when wearing glasses/contacts?: No Does the patient have difficulty concentrating, remembering, or making decisions?: No Does the patient have difficulty walking or climbing stairs?: No Does the patient have difficulty dressing or bathing?: No Does the patient have difficulty doing errands alone such as visiting a doctor's office or shopping?: No  6CIT Screen 04/15/2018 03/21/2017  What Year? 0 points 0 points  What month? 0 points 0 points  What time? 0 points 0 points  Count back from 20 0 points 0  points  Months in reverse 0 points 0 points  Repeat phrase 0 points 0 points  Total Score 0 0     Hearing Screening   _0  _1  _2  _3  _4  _5  _6  _7  _8   Right ear:           Left ear:             Visual Acuity Screening   Right eye Left eye Both eyes  Without correction:     With correction: _9  appt Monday with optho    Office Visit from 04/22/2019 in Primary Care at [facility]  AUDIT-C Score  0     Dental: every 6 months, appt coming up soon.   Exercise: less exercise due to pandemic and avoidance of gym.   Advanced Directives: has living will, HCPOA.   No cough/wheeze, no recent illness. No chest pain.   Prediabetes:  Lab Results  Component Value Date   HGBA1C 5.8 (H) 04/15/2018   Wt Readings from Last 3 Encounters:  04/22/19 204 lb 3.2 oz (92.6 kg)  04/15/18 203 lb 3.2 oz (92.2 kg)  12/29/17 206 lb (93.4 kg)     History There are no problems to display for this patient.  Past Medical History:  Diagnosis Date  . Heart murmur   . Nervous stomach    Past Surgical History:  Procedure Laterality Date  . COLONOSCOPY    . CYSTECTOMY     left leg  . POLYPECTOMY     No Known Allergies Prior to Admission medications  Medication Sig Start Date End Date Taking? Authorizing Provider  aspirin 81 MG tablet Take 162 mg by mouth daily.    Yes [provider]  Multiple Vitamins-Minerals (MULTIVITAMIN WITH MINERALS) tablet Take 1 tablet by mouth daily.   Yes [provider]  cyclobenzaprine (FLEXERIL) 5 MG tablet Take 1 tablet (5 mg total) by mouth 3 (three) times daily as needed for muscle spasms. Patient not taking: Reported on 04/22/2019 02/04/18   Drenda Freeze, MD   Social History   Socioeconomic History  . Marital status: Married    Spouse name: Not on file  . Number of children: Not on file  . Years of education: Not on file  . Highest education level: Not on file  Occupational History  .  Occupation: Herbalist  Tobacco Use  . Smoking status: Former Smoker    Years: 5.00    Types: Cigarettes  . Smokeless tobacco: Never Used  . Tobacco comment: in college  Substance and Sexual Activity  . Alcohol use: No    Alcohol/week: 0.0 standard drinks  . Drug use: No  . Sexual activity: Yes  Other Topics Concern  . Not on file  Social History Narrative   Married.   Education: [employer]   Exercise: Yes   Social Determinants of Health   Financial Resource Strain:   . Difficulty of Paying Living Expenses: Not on file  Food Insecurity:   . Worried About Charity fundraiser in the Last Year: Not on file  . Ran Out of Food in the Last Year: Not on file  Transportation Needs:   . Lack of Transportation (Medical): Not on file  . Lack of Transportation (Non-Medical): Not on file  Physical Activity:   . Days of Exercise per Week: Not on file  . Minutes of Exercise per Session: Not on file  Stress:   . Feeling of Stress : Not on file  Social Connections:   . Frequency of Communication with Friends and Family: Not on file  . Frequency of Social Gatherings with Friends and Family: Not on file  . Attends Religious Services: Not on file  . Active Member of Clubs or Organizations: Not on file  . Attends Archivist Meetings: Not on file  . Marital Status: Not on file  Intimate Partner Violence:   . Fear of Current or Ex-Partner: Not on file  . Emotionally Abused: Not on file  . Physically Abused: Not on file  . Sexually Abused: Not on file    Review of Systems   Objective:   Vitals:   04/22/19 0811 04/22/19 0818  BP: (!) 172/72 140/76  Pulse: 66   Temp: 98 F (36.7 C)   TempSrc: Temporal   SpO2: 95%   Weight: 204 lb 3.2 oz (92.6 kg)   Height: _0  (1.854 m)      Physical Exam Vitals reviewed.  Constitutional:      Appearance: He is well-developed.  HENT:     Head: Normocephalic and atraumatic.     Right Ear: External ear normal.      Left Ear: External ear normal.  Eyes:     Conjunctiva/sclera: Conjunctivae normal.     Pupils: Pupils are equal, round, and reactive to light.  Neck:     Thyroid: No thyromegaly.  Cardiovascular:     Rate and Rhythm: Normal rate and regular rhythm.     Heart sounds: Normal heart sounds.  Pulmonary:     Effort: Pulmonary effort  is normal. No respiratory distress.     Breath sounds: Rales (few faint rales lower. normal effort) present. No rhonchi.  Abdominal:     General: There is no distension.     Palpations: Abdomen is soft.     Tenderness: There is no abdominal tenderness.     Hernia: There is no hernia in the left inguinal area or right inguinal area.  Genitourinary:    Prostate: Normal.  Musculoskeletal:        General: No tenderness. Normal range of motion.     Cervical back: Normal range of motion and neck supple.     Right lower leg: No edema.     Left lower leg: No edema.  Lymphadenopathy:     Cervical: No cervical adenopathy.  Skin:    General: Skin is warm and dry.  Neurological:     Mental Status: He is alert and oriented to person, place, and time.     Deep Tendon Reflexes: Reflexes are normal and symmetric.  Psychiatric:        Mood and Affect: Mood normal.        Behavior: Behavior normal.     DG Chest 2 View  Result Date: 04/22/2019 CLINICAL DATA:  Rales. Rule out infiltrate/free fluid EXAM: CHEST - 2 VIEW COMPARISON:  None. FINDINGS: Normal heart size. No pleural effusion or edema. There are chronic appearing coarsened interstitial markings in both lungs. Asymmetric opacity within the right upper lobe is identified concerning for pneumonia. IMPRESSION: 1. Asymmetric opacity in the right upper lobe may represent pneumonia. 2. Followup PA and lateral chest X-ray is recommended in 3-4 weeks following trial of antibiotic therapy to ensure resolution and exclude underlying malignancy. Electronically Signed   By: Kerby Moors M.D.   On: 04/22/2019 09:25       Assessment & Plan:  Eric Arndt. is a 79 y.o. male . Encounter for Medicare annual wellness exam  - - anticipatory guidance as below in AVS, screening labs if needed. Health maintenance items as above in HPI discussed/recommended as applicable.  - no concerning responses on depression, fall, or functional status screening. Any positive responses noted as above. Advanced directives discussed as in CHL.   Need for influenza vaccination - Plan: Influenza (Seasonal)  Prediabetes - Plan: Comprehensive metabolic panel, Hemoglobin A1c  - continue to watch diet, exercise.   Lung field abnormal finding on examination - Plan: DG Chest 2 View  -Few coarse breath sounds heard on exam.  Denies cough, wheezing, dyspnea, asymptomatic.  On chart review remote tobacco abuse, but less than 10 years.  Clinically does not have any signs or symptoms of pneumonia at this time.  Potentially may need CT to evaluate right upper lobe abnormality further.    -Reviewed x-ray results on 1/19.  Denies cough, dyspnea, fatigue, fever, symptoms.  Remote tobacco history in college for about 3 years.  Will check CT with contrast to rule out mass in area of opacity.  Recent renal function okay.  RTC precautions/ER precautions if symptomatic No orders of the defined types were placed in this encounter.  Patient Instructions    I do recommend covid 19 vaccine.  COVID-19 Vaccine Information can be found at: ShippingScam.co.uk For questions related to vaccine distribution or appointments, please email [email or web address] or call [phone].   Here is a link to more information about the COVID-19 vaccine: RecruitSuit.ca   Some form of exercise most days per week, 30 minutes at a time, with a goal of 150  minutes/week.  I will recheck blood sugar today as well as some other lab work.  I will also check a chest x-ray as there were a  few sounds heard on exam but I am not concerned.  If there are any concerns on your x-ray, I will let you know.  Follow-up in 1 year for physical, please let me know if there are questions sooner and take care.   Preventive Care 1 Years and Older, Male Preventive care refers to lifestyle choices and visits with your health care provider that can promote health and wellness. This includes:  A yearly physical exam. This is also called an annual well check.  Regular dental and eye exams.  Immunizations.  Screening for certain conditions.  Healthy lifestyle choices, such as diet and exercise. What can I expect for my preventive care visit? Physical exam Your health care provider will check:  Height and weight. These may be used to calculate body mass index (BMI), which is a measurement that tells if you are at a healthy weight.  Heart rate and blood pressure.  Your skin for abnormal spots. Counseling Your health care provider may ask you questions about:  Alcohol, tobacco, and drug use.  Emotional well-being.  Home and relationship well-being.  Sexual activity.  Eating habits.  History of falls.  Memory and ability to understand (cognition).  Work and work Statistician. What immunizations do I need?  Influenza (flu) vaccine  This is recommended every year. Tetanus, diphtheria, and pertussis (Tdap) vaccine  You may need a Td booster every 10 years. Varicella (chickenpox) vaccine  You may need this vaccine if you have not already been vaccinated. Zoster (shingles) vaccine  You may need this after age 40. Pneumococcal conjugate (PCV13) vaccine  One dose is recommended after age 62. Pneumococcal polysaccharide (PPSV23) vaccine  One dose is recommended after age 70. Measles, mumps, and rubella (MMR) vaccine  You may need at least one dose of MMR if you were born in 1957 or later. You may also need a second dose. Meningococcal conjugate (MenACWY) vaccine  You  may need this if you have certain conditions. Hepatitis A vaccine  You may need this if you have certain conditions or if you travel or work in places where you may be exposed to hepatitis A. Hepatitis B vaccine  You may need this if you have certain conditions or if you travel or work in places where you may be exposed to hepatitis B. Haemophilus influenzae type b (Hib) vaccine  You may need this if you have certain conditions. You may receive vaccines as individual doses or as more than one vaccine together in one shot (combination vaccines). Talk with your health care provider about the risks and benefits of combination vaccines. What tests do I need? Blood tests  Lipid and cholesterol levels. These may be checked every 5 years, or more frequently depending on your overall health.  Hepatitis C test.  Hepatitis B test. Screening  Lung cancer screening. You may have this screening every year starting at age 27 if you have a 30-pack-year history of smoking and currently smoke or have quit within the past 15 years.  Colorectal cancer screening. All adults should have this screening starting at age 13 and continuing until age 57. Your health care provider may recommend screening at age 52 if you are at increased risk. You will have tests every 1-10 years, depending on your results and the type of screening test.  Prostate cancer screening. Recommendations  will vary depending on your family history and other risks.  Diabetes screening. This is done by checking your blood sugar (glucose) after you have not eaten for a while (fasting). You may have this done every 1-3 years.  Abdominal aortic aneurysm (AAA) screening. You may need this if you are a current or former smoker.  Sexually transmitted disease (STD) testing. Follow these instructions at home: Eating and drinking  Eat a diet that includes fresh fruits and vegetables, whole grains, lean protein, and low-fat dairy products. Limit  your intake of foods with high amounts of sugar, saturated fats, and salt.  Take vitamin and mineral supplements as recommended by your health care provider.  Do not drink alcohol if your health care provider tells you not to drink.  If you drink alcohol: ? Limit how much you have to 0-2 drinks a day. ? Be aware of how much alcohol is in your drink. In the [place] one drink equals one 12 oz bottle of beer (355 mL), one 5 oz glass of wine (148 mL), or one 1 oz glass of hard liquor (44 mL). Lifestyle  Take daily care of your teeth and gums.  Stay active. Exercise for at least 30 minutes on 5 or more days each week.  Do not use any products that contain nicotine or tobacco, such as cigarettes, e-cigarettes, and chewing tobacco. If you need help quitting, ask your health care provider.  If you are sexually active, practice safe sex. Use a condom or other form of protection to prevent STIs (sexually transmitted infections).  Talk with your health care provider about taking a low-dose aspirin or statin. What's next?  Visit your health care provider once a year for a well check visit.  Ask your health care provider how often you should have your eyes and teeth checked.  Stay up to date on all vaccines. This information is not intended to replace advice given to you by your health care provider. Make sure you discuss any questions you have with your health care provider. Document Revised: 03/19/2018 Document Reviewed: 03/19/2018 Elsevier Patient Education  [employer].    If you have lab work done today you will be contacted with your lab results within the next 2 weeks.  If you have not heard from [place] then please contact us. The fastest way to get your results is to register for My Chart.   IF you received an x-ray today, you will receive an invoice from [facility]. Please contact [facility] at [phone] with questions or concerns regarding your invoice.    IF you received labwork today, you will receive an invoice from [place]. Please contact [facility] at [phone] with questions or concerns regarding your invoice.   Our billing staff will not be able to assist you with questions regarding bills from these companies.  You will be contacted with the lab results as soon as they are available. The fastest way to get your results is to activate your My Chart account. Instructions are located on the last page of this paperwork. If you have not heard from [place] regarding the results in 2 weeks, please contact this office.         Signed, [name], MD [facility]

## 2019-04-22 NOTE — Patient Instructions (Addendum)
I do recommend covid 19 vaccine.  COVID-19 Vaccine Information can be found at: ShippingScam.co.uk For questions related to vaccine distribution or appointments, please email [email or web address] or call [phone].   Here is a link to more information about the COVID-19 vaccine: [employer].ca   Some form of exercise most days per week, 30 minutes at a time, with a goal of 150 minutes/week.  I will recheck blood sugar today as well as some other lab work.  I will also check a chest x-ray as there were a few sounds heard on exam but I am not concerned.  If there are any concerns on your x-ray, I will let you know.  Follow-up in 1 year for physical, please let me know if there are questions sooner and take care.   Preventive Care 75 Years and Older, Male Preventive care refers to lifestyle choices and visits with your health care provider that can promote health and wellness. This includes:  A yearly physical exam. This is also called an annual well check.  Regular dental and eye exams.  Immunizations.  Screening for certain conditions.  Healthy lifestyle choices, such as diet and exercise. What can I expect for my preventive care visit? Physical exam Your health care provider will check:  Height and weight. These may be used to calculate body mass index (BMI), which is a measurement that tells if you are at a healthy weight.  Heart rate and blood pressure.  Your skin for abnormal spots. Counseling Your health care provider may ask you questions about:  Alcohol, tobacco, and drug use.  Emotional well-being.  Home and relationship well-being.  Sexual activity.  Eating habits.  History of falls.  Memory and ability to understand (cognition).  Work and work Statistician. What immunizations do I need?  Influenza (flu) vaccine  This is recommended every year. Tetanus, diphtheria, and  pertussis (Tdap) vaccine  You may need a Td booster every 10 years. Varicella (chickenpox) vaccine  You may need this vaccine if you have not already been vaccinated. Zoster (shingles) vaccine  You may need this after age 36. Pneumococcal conjugate (PCV13) vaccine  One dose is recommended after age 59. Pneumococcal polysaccharide (PPSV23) vaccine  One dose is recommended after age 69. Measles, mumps, and rubella (MMR) vaccine  You may need at least one dose of MMR if you were born in 1957 or later. You may also need a second dose. Meningococcal conjugate (MenACWY) vaccine  You may need this if you have certain conditions. Hepatitis A vaccine  You may need this if you have certain conditions or if you travel or work in places where you may be exposed to hepatitis A. Hepatitis B vaccine  You may need this if you have certain conditions or if you travel or work in places where you may be exposed to hepatitis B. Haemophilus influenzae type b (Hib) vaccine  You may need this if you have certain conditions. You may receive vaccines as individual doses or as more than one vaccine together in one shot (combination vaccines). Talk with your health care provider about the risks and benefits of combination vaccines. What tests do I need? Blood tests  Lipid and cholesterol levels. These may be checked every 5 years, or more frequently depending on your overall health.  Hepatitis C test.  Hepatitis B test. Screening  Lung cancer screening. You may have this screening every year starting at age 12 if you have a 30-pack-year history of smoking and currently smoke or have quit  within the past 15 years.  Colorectal cancer screening. All adults should have this screening starting at age 54 and continuing until age 57. Your health care provider may recommend screening at age 99 if you are at increased risk. You will have tests every 1-10 years, depending on your results and the type of  screening test.  Prostate cancer screening. Recommendations will vary depending on your family history and other risks.  Diabetes screening. This is done by checking your blood sugar (glucose) after you have not eaten for a while (fasting). You may have this done every 1-3 years.  Abdominal aortic aneurysm (AAA) screening. You may need this if you are a current or former smoker.  Sexually transmitted disease (STD) testing. Follow these instructions at home: Eating and drinking  Eat a diet that includes fresh fruits and vegetables, whole grains, lean protein, and low-fat dairy products. Limit your intake of foods with high amounts of sugar, saturated fats, and salt.  Take vitamin and mineral supplements as recommended by your health care provider.  Do not drink alcohol if your health care provider tells you not to drink.  If you drink alcohol: ? Limit how much you have to 0-2 drinks a day. ? Be aware of how much alcohol is in your drink. In the [place] one drink equals one 12 oz bottle of beer (355 mL), one 5 oz glass of wine (148 mL), or one 1 oz glass of hard liquor (44 mL). Lifestyle  Take daily care of your teeth and gums.  Stay active. Exercise for at least 30 minutes on 5 or more days each week.  Do not use any products that contain nicotine or tobacco, such as cigarettes, e-cigarettes, and chewing tobacco. If you need help quitting, ask your health care provider.  If you are sexually active, practice safe sex. Use a condom or other form of protection to prevent STIs (sexually transmitted infections).  Talk with your health care provider about taking a low-dose aspirin or statin. What's next?  Visit your health care provider once a year for a well check visit.  Ask your health care provider how often you should have your eyes and teeth checked.  Stay up to date on all vaccines. This information is not intended to replace advice given to you by your health care provider.  Make sure you discuss any questions you have with your health care provider. Document Revised: 03/19/2018 Document Reviewed: 03/19/2018 Elsevier Patient Education  [employer].    If you have lab work done today you will be contacted with your lab results within the next 2 weeks.  If you have not heard from [place] then please contact us. The fastest way to get your results is to register for My Chart.   IF you received an x-ray today, you will receive an invoice from [facility]. Please contact [facility] at [phone] with questions or concerns regarding your invoice.   IF you received labwork today, you will receive an invoice from [place] [facility]. Please contact [facility] at [phone] with questions or concerns regarding your invoice.   Our billing staff will not be able to assist you with questions regarding bills from these companies.  You will be contacted with the lab results as soon as they are available. The fastest way to get your results is to activate your My Chart account. Instructions are located on the last page of this paperwork. If you have not heard from [place] regarding the results in 2  weeks, please contact this office.

## 2019-04-26 DIAGNOSIS — H353132 Nonexudative age-related macular degeneration, bilateral, intermediate dry stage: Secondary | ICD-10-CM | POA: Diagnosis not present

## 2019-04-27 ENCOUNTER — Other Ambulatory Visit: Payer: Self-pay | Admitting: Family Medicine

## 2019-04-27 DIAGNOSIS — R918 Other nonspecific abnormal finding of lung field: Secondary | ICD-10-CM

## 2019-05-10 ENCOUNTER — Telehealth: Payer: Self-pay | Admitting: Family Medicine

## 2019-05-10 NOTE — Telephone Encounter (Signed)
CB needed for authorization at [phone] Tiara Pt has appt on 2/4 at [facility]. Please advise

## 2019-05-10 NOTE — Telephone Encounter (Signed)
Please advise 

## 2019-05-11 NOTE — Telephone Encounter (Signed)
Eric Ferrell calling back regarding this request

## 2019-05-13 ENCOUNTER — Other Ambulatory Visit: Payer: Medicare Other

## 2019-05-18 NOTE — Telephone Encounter (Signed)
Without any infectious symptoms, I am hesitant to start antibiotic for abnormality seen on chest x-ray.  It appears chest CT will not be covered based on that report.  If possible, I would like to have a peer to peer review if needed to have CT scan authorized.  Please let me know what I need to do.

## 2019-05-23 ENCOUNTER — Ambulatory Visit: Payer: Medicare PPO | Attending: Internal Medicine

## 2019-05-23 DIAGNOSIS — Z23 Encounter for immunization: Secondary | ICD-10-CM | POA: Insufficient documentation

## 2019-05-23 NOTE — Progress Notes (Signed)
   [id number]  Name:  Eric Ferrell.    MRN: [id number] DOB: 1940-08-03  05/23/2019  [name] was observed post Covid-19 immunization for 15 minutes without incidence. He was provided with Vaccine Information Sheet and instruction to access the V-Safe system.   [name] was instructed to call 911 with any severe reactions post vaccine: Marland Kitchen Difficulty breathing  . Swelling of your face and throat  . A fast heartbeat  . A bad rash all over your body  . Dizziness and weakness    Immunizations Administered    Name Date Dose VIS Date Route   Pfizer COVID-19 Vaccine 05/23/2019  3:04 PM 0.3 mL 03/19/2019 Intramuscular   Manufacturer: [place]   Lot: [id number]   [place]: [id number]

## 2019-06-15 ENCOUNTER — Ambulatory Visit: Payer: Medicare PPO | Attending: Internal Medicine

## 2019-06-15 DIAGNOSIS — Z23 Encounter for immunization: Secondary | ICD-10-CM | POA: Insufficient documentation

## 2019-06-15 NOTE — Progress Notes (Signed)
   [id number]  Name:  Eric Ferrell.    MRN: [id number] DOB: April 02, 1941  06/15/2019  Eric Ferrell was observed post Covid-19 immunization for 15 minutes without incident. He was provided with Vaccine Information Sheet and instruction to access the V-Safe system.   Eric Ferrell was instructed to call 911 with any severe reactions post vaccine: Marland Kitchen Difficulty breathing  . Swelling of face and throat  . A fast heartbeat  . A bad rash all over body  . Dizziness and weakness   Immunizations Administered    Name Date Dose VIS Date Route   Pfizer COVID-19 Vaccine 06/15/2019  5:13 PM 0.3 mL 03/19/2019 Intramuscular   Manufacturer: [place]   Lot: [id number]   [place]: [id number]

## 2019-06-16 ENCOUNTER — Ambulatory Visit: Payer: Medicare PPO

## 2019-06-22 ENCOUNTER — Telehealth: Payer: Self-pay

## 2019-06-22 NOTE — Telephone Encounter (Signed)
lvm per ROI referral coordinator is doing an appeal on his mri and as soon as she gets an response from insurance as to what dr Carlota Raspberry needs to do to get mri approved-we will contact him with update.  Advised pt to call back if he has any questions or concerns.

## 2019-10-25 DIAGNOSIS — H353132 Nonexudative age-related macular degeneration, bilateral, intermediate dry stage: Secondary | ICD-10-CM | POA: Diagnosis not present

## 2020-01-22 ENCOUNTER — Ambulatory Visit: Payer: Medicare HMO | Attending: Internal Medicine

## 2020-01-22 DIAGNOSIS — Z23 Encounter for immunization: Secondary | ICD-10-CM

## 2020-01-22 NOTE — Progress Notes (Signed)
   [id number]  Name:  Deondrea Markos.    MRN: [id number] DOB: 06/17/1940  01/22/2020  Mr. Molner was observed post Covid-19 immunization for 15 minutes without incident. He was provided with Vaccine Information Sheet and instruction to access the V-Safe system.   Mr. Westerhold was instructed to call 911 with any severe reactions post vaccine: Marland Kitchen Difficulty breathing  . Swelling of face and throat  . A fast heartbeat  . A bad rash all over body  . Dizziness and weakness

## 2020-04-26 DIAGNOSIS — H353132 Nonexudative age-related macular degeneration, bilateral, intermediate dry stage: Secondary | ICD-10-CM | POA: Diagnosis not present

## 2020-07-14 ENCOUNTER — Encounter: Payer: Self-pay | Admitting: Family Medicine

## 2020-11-08 IMAGING — DX DG CHEST 2V
2 series · 2 of 2 positions shown · non-contrast
Comparison: None.

CLINICAL DATA: [place]. Rule out infiltrate/free fluid

EXAM:
CHEST - 2 VIEW

[chest pa]
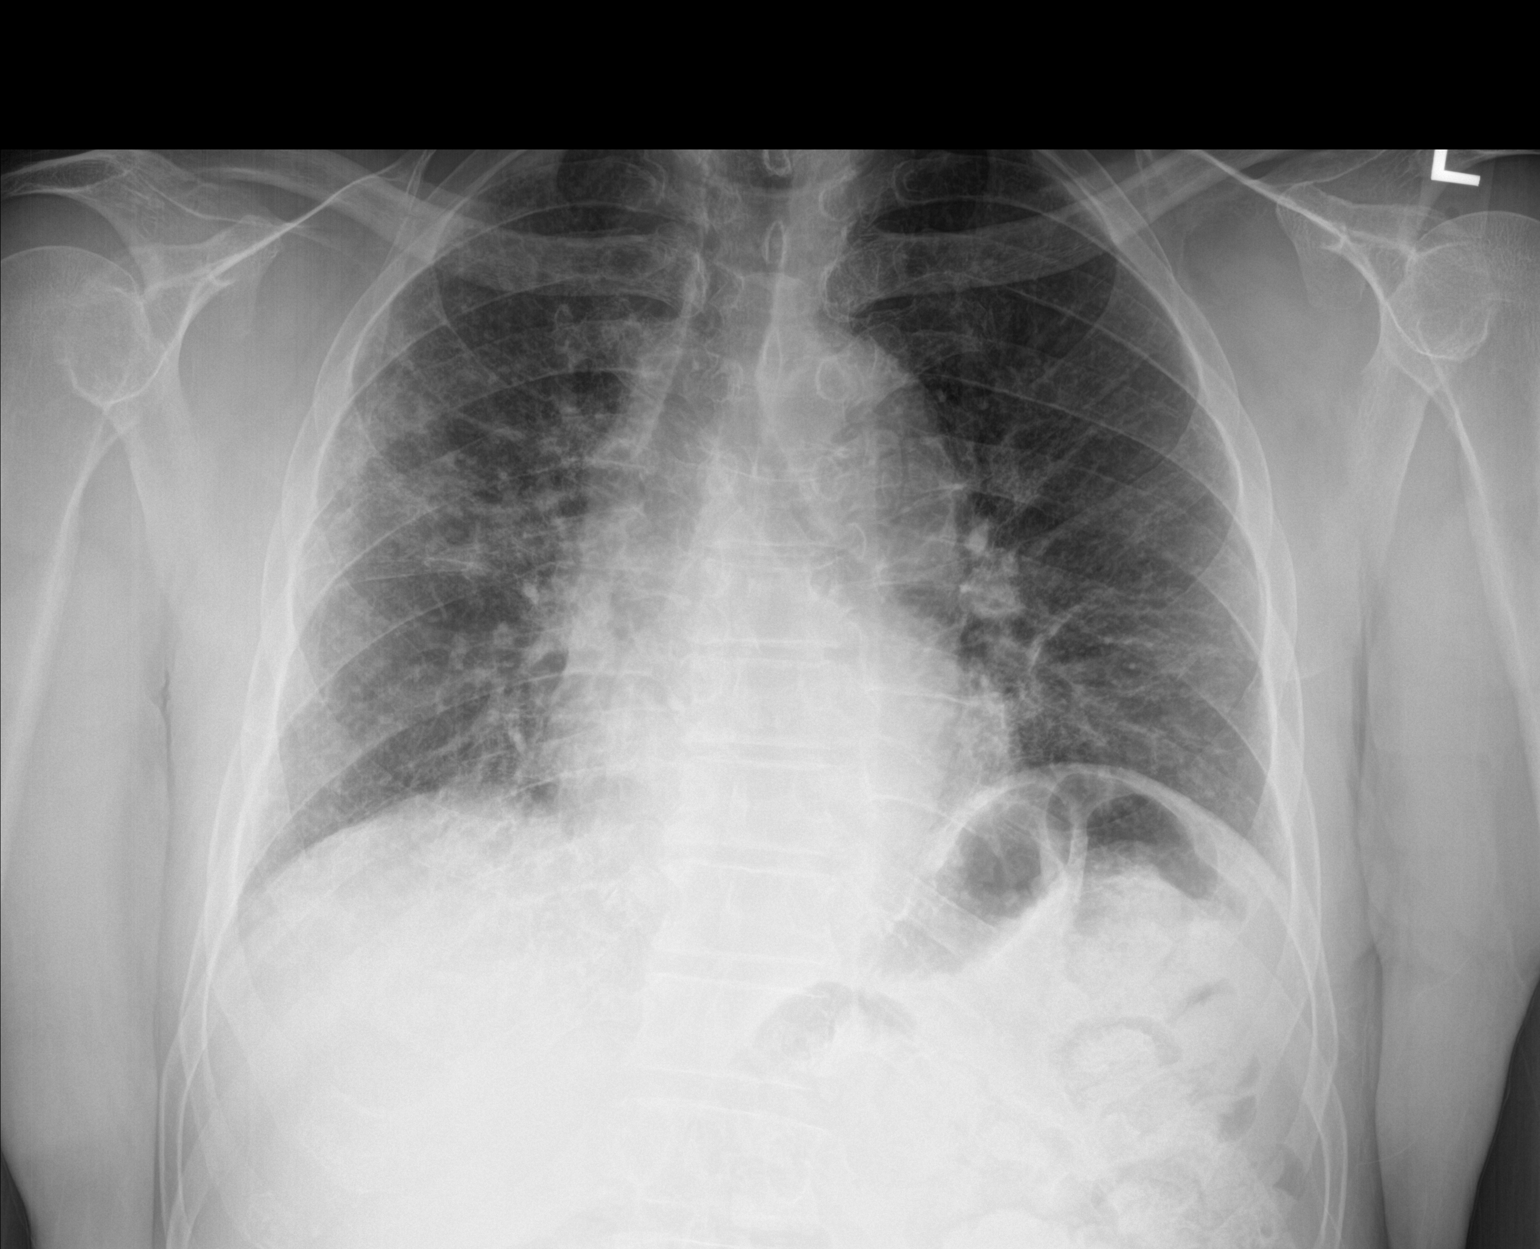

[chest lat]
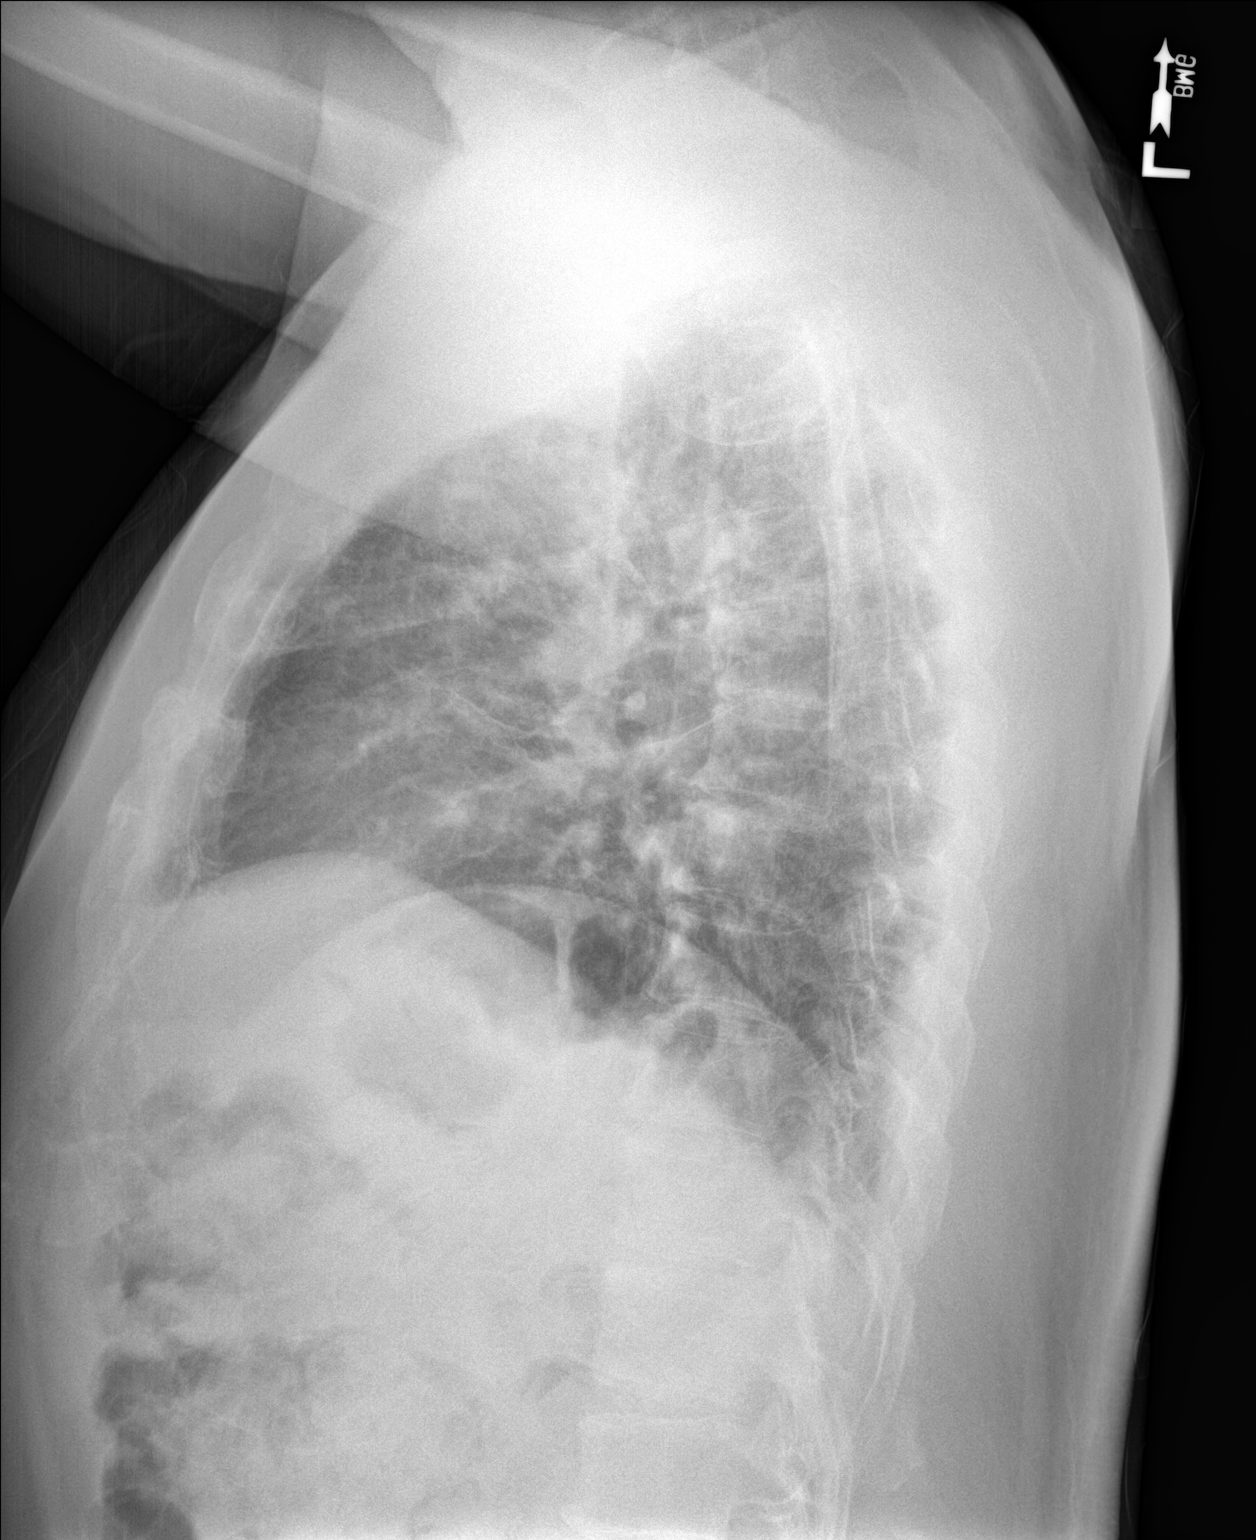

[2 of 2 positions shown; findings below may reference images not displayed]

FINDINGS: Normal heart size. No pleural effusion or edema. There are chronic
appearing coarsened interstitial markings in both lungs. Asymmetric
opacity within the right upper lobe is identified concerning for
pneumonia.
IMPRESSION: 1. Asymmetric opacity in the right upper lobe may represent
pneumonia.
2. Followup PA and lateral chest X-ray is recommended in 3-4 weeks
following trial of antibiotic therapy to ensure resolution and
exclude underlying malignancy.

## 2020-11-20 ENCOUNTER — Encounter: Payer: Self-pay | Admitting: Family Medicine

## 2021-01-01 ENCOUNTER — Other Ambulatory Visit: Payer: Self-pay

## 2021-01-01 ENCOUNTER — Encounter: Payer: Self-pay | Admitting: Family Medicine

## 2021-01-01 ENCOUNTER — Ambulatory Visit (INDEPENDENT_AMBULATORY_CARE_PROVIDER_SITE_OTHER): Payer: Medicare PPO | Admitting: Family Medicine

## 2021-01-01 VITALS — BP 138/64 | Temp 97.2°F | Ht 71.0 in | Wt 193.0 lb

## 2021-01-01 DIAGNOSIS — Z23 Encounter for immunization: Secondary | ICD-10-CM

## 2021-01-01 DIAGNOSIS — Z131 Encounter for screening for diabetes mellitus: Secondary | ICD-10-CM

## 2021-01-01 DIAGNOSIS — Z0000 Encounter for general adult medical examination without abnormal findings: Secondary | ICD-10-CM | POA: Diagnosis not present

## 2021-01-01 DIAGNOSIS — R350 Frequency of micturition: Secondary | ICD-10-CM

## 2021-01-01 LAB — POCT URINALYSIS DIP (MANUAL ENTRY)
Bilirubin, UA: NEGATIVE
Blood, UA: NEGATIVE
Glucose, UA: NEGATIVE mg/dL
Ketones, POC UA: NEGATIVE mg/dL
Leukocytes, UA: NEGATIVE
Nitrite, UA: NEGATIVE
Protein Ur, POC: NEGATIVE mg/dL
Spec Grav, UA: <=1.005 — AB (ref 1.010–1.025)
Urobilinogen, UA: NEGATIVE E.U./dL — AB
pH, UA: 5 (ref 5.0–8.0)

## 2021-01-01 NOTE — Progress Notes (Signed)
Subjective:  Patient ID: Eric Noble., male    DOB: Aug 14, 1940  Age: 80 y.o. MRN: [id number]  CC:  Chief Complaint  Patient presents with   Annual Exam    Annual wellness exam    HPI Eric Ferrell. presents for   Presents for annual wellness exam.   Care team: PCP: me Optho: [facility]: Dr. Hilarie Fredrickson.   Increased urination.  Past 2 months. No dysuria/hematuria. Occasional nocturia. No change in thirst, but does drink more water in general. Some energy drinks. No blurry vision.  Fall screening Fall Risk  01/01/2021 04/22/2019 04/15/2018 03/21/2017 03/14/2016  Falls in the past year? 0 0 0 No No  Number falls in past yr: 0 0 0 - -  Injury with Fall? 0 0 - - -   Lighting in home: adequate.  Loose rugs/carpets/pets: none . Stairs: none.  Grab bars in bathroom: none, small BR.  Timed up and go: 9 seconds, normal gait, no instability.   Depression Screening: Depression screen [facility] 2/9 01/01/2021 04/22/2019 04/15/2018 03/21/2017 03/14/2016  Decreased Interest 0 0 0 0 0  Down, Depressed, Hopeless 0 0 0 0 0  PHQ - 2 Score 0 0 0 0 0    Cancer Screening: Colonoscopy in 12/2017 - multiple polyps, Dr. Hilarie Fredrickson - repeat in 3 years. The natural history of prostate cancer and ongoing controversy regarding screening and potential treatment outcomes of prostate cancer has been discussed with the patient. The meaning of a false positive PSA and a false negative PSA has been discussed. He indicates understanding of the limitations of this screening test and wishes NOT to proceed with screening PSA testing. Lab Results  Component Value Date   PSA1 1.1 04/15/2018   PSA1 1.3 03/14/2016   PSA 0.88 03/13/2015   PSA 0.64 04/11/2013     Immunization History  Administered Date(s) Administered   Fluad Quad(high Dose 65+) 04/22/2019, 01/01/2021   Influenza, High Dose Seasonal PF 04/15/2018   Influenza,inj,Quad PF,6+ Mos 03/13/2015, 01/11/2016, 03/21/2017   Influenza-Unspecified 04/08/2013    PFIZER(Purple Top)SARS-COV-2 Vaccination 05/23/2019, 06/15/2019, 01/22/2020   Pneumococcal Conjugate-13 03/13/2015   Pneumococcal Polysaccharide-23 04/11/2013   Tdap 03/13/2015   Zoster Recombinat (Shingrix) 10/15/2017, 04/09/2018   Zoster, Live 04/08/2014  Bivalent covid booster - recommended.  Flu shot today.   Functional Status Survey: Is the patient deaf or have difficulty hearing?: No Does the patient have difficulty seeing, even when wearing glasses/contacts?: Yes (reading glasses) Does the patient have difficulty concentrating, remembering, or making decisions?: No Does the patient have difficulty walking or climbing stairs?: No Does the patient have difficulty dressing or bathing?: No Does the patient have difficulty doing errands alone such as visiting a doctor's office or shopping?: No  Memory Screen: 6CIT Screen 01/01/2021 04/15/2018 03/21/2017  What Year? 0 points 0 points 0 points  What month? 0 points 0 points 0 points  What time? 0 points 0 points 0 points  Count back from 20 0 points 0 points 0 points  Months in reverse 0 points 0 points 0 points  Repeat phrase 8 points 0 points 0 points  Total Score 8 0 0  Repeat phrase assessment - normal - 6cit score of 0.   Alcohol Screening: [facility] Visit from 01/01/2021 in [facility]  AUDIT-C Score 0       Tobacco: None.   Vision Screening   Right eye Left eye Both eyes  Without correction     With correction 20/20  20/20 20/20   Optho/optometry:Dr. Idolina Primer - every 20months  Dental: Every 92months.   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   Advanced Directives:  Needs paperwork for living will, [place].    History There are no problems to display for this patient.  Past Medical History:  Diagnosis Date   Heart murmur    Nervous stomach    Past Surgical History:  Procedure Laterality Date   COLONOSCOPY     CYSTECTOMY      left leg   POLYPECTOMY     No Known Allergies Prior to Admission medications   Medication Sig Start Date End Date Taking? Authorizing Provider  aspirin 81 MG tablet Take 162 mg by mouth daily.    Yes [provider]  Multiple Vitamins-Minerals (MULTIVITAMIN WITH MINERALS) tablet Take 1 tablet by mouth daily.   Yes [provider]   Social History   Socioeconomic History   Marital status: Married    Spouse name: Not on file   Number of children: Not on file   Years of education: Not on file   Highest education level: Not on file  Occupational History   Occupation: Herbalist  Tobacco Use   Smoking status: Former    Years: 5.00    Types: Cigarettes   Smokeless tobacco: Never   Tobacco comments:    in college  Vaping Use   Vaping Use: Never used  Substance and Sexual Activity   Alcohol use: No    Alcohol/week: 0.0 standard drinks   Drug use: No   Sexual activity: Yes  Other Topics Concern   Not on file  Social History Narrative   Married.   Education: [employer]   Exercise: Yes   Social Determinants of Radio broadcast assistant Strain: Not on file  Food Insecurity: Not on file  Transportation Needs: Not on file  Physical Activity: Not on file  Stress: Not on file  Social Connections: Not on file  Intimate Partner Violence: Not on file    Review of Systems   Objective:   Vitals:   01/01/21 1327  BP: 138/64  Temp: (!) 97.2 F (36.2 C)  SpO2: 97%  Weight: 193 lb (87.5 kg)  Height: 5\' 11"  (1.803 m)     Physical Exam Vitals reviewed.  Constitutional:      Appearance: He is well-developed.  HENT:     Head: Normocephalic and atraumatic.     Right Ear: External ear normal.     Left Ear: External ear normal.  Eyes:     Conjunctiva/sclera: Conjunctivae normal.     Pupils: Pupils are equal, round, and reactive to light.  Neck:     Thyroid: No thyromegaly.  Cardiovascular:     Rate and Rhythm: Normal rate and regular  rhythm.     Heart sounds: Murmur (3-3/0 systolic.) heard.  Pulmonary:     Effort: Pulmonary effort is normal. No respiratory distress.     Breath sounds: Normal breath sounds. No wheezing.  Abdominal:     General: There is no distension.     Palpations: Abdomen is soft.     Tenderness: There is no abdominal tenderness.  Musculoskeletal:        General: No tenderness. Normal range of motion.     Cervical back: Normal range of motion and neck supple.  Lymphadenopathy:     Cervical: No cervical adenopathy.  Skin:    General: Skin is warm and dry.  Neurological:  Mental Status: He is alert and oriented to person, place, and time.     Deep Tendon Reflexes: Reflexes are normal and symmetric.  Psychiatric:        Behavior: Behavior normal.    Assessment & Plan:  Eric Ferrell. is a 80 y.o. male . Medicare annual wellness visit, subsequent  - - anticipatory guidance as below in AVS, screening labs if needed. Health maintenance items as above in HPI discussed/recommended as applicable.  - no concerning responses on depression, fall, or functional status screening. Any positive responses noted as above. Advanced directives discussed as in CHL.   Need for immunization against influenza - Plan: Flu Vaccine QUAD High Dose(Fluad)  Urinary frequency - Plan: PSA, Comprehensive metabolic panel, POCT urinalysis dipstick, Hemoglobin A1c Screening for diabetes mellitus - Plan: Comprehensive metabolic panel, Hemoglobin A1c  -Recent urinary frequency as above, without associated concerning symptoms.  Decreased energy drinks, check PSA. urinalysis reassuring, screen for diabetes with A1c, handout given, RTC precautions  No orders of the defined types were placed in this encounter.  Patient Instructions  Call Dr. Vena Rua office to schedule repeat colonoscopy (3 year repeat planned in 12/2017).  [facility] is now offering the bivalent Covid booster vaccine. To schedule an appointment or to find  more information, visit [email or web address] You can also call [phone], Monday through Friday, 7 a.m. to 7 p.m.  I will check some tests for the frequent urination, but see information below.  Try to decrease energy drinks.  If the symptoms continue please follow-up for separate appointment.  Follow-up sooner if any worsening of symptoms. Thanks for coming in today.  Preventive Care 11 Years and Older, Male Preventive care refers to lifestyle choices and visits with your health care provider that can promote health and wellness. This includes: A yearly physical exam. This is also called an annual wellness visit. Regular dental and eye exams. Immunizations. Screening for certain conditions. Healthy lifestyle choices, such as: Eating a healthy diet. Getting regular exercise. Not using drugs or products that contain nicotine and tobacco. Limiting alcohol use. What can I expect for my preventive care visit? Physical exam Your health care provider will check your: Height and weight. These may be used to calculate your BMI (body mass index). BMI is a measurement that tells if you are at a healthy weight. Heart rate and blood pressure. Body temperature. Skin for abnormal spots. Counseling Your health care provider may ask you questions about your: Past medical problems. Family's medical history. Alcohol, tobacco, and drug use. Emotional well-being. Home life and relationship well-being. Sexual activity. Diet, exercise, and sleep habits. History of falls. Memory and ability to understand (cognition). Work and work Statistician. Access to firearms. What immunizations do I need? Vaccines are usually given at various ages, according to a schedule. Your health care provider will recommend vaccines for you based on your age, medical history, and lifestyle or other factors, such as travel or where you work. What tests do I need? Blood tests Lipid and cholesterol levels. These may be  checked every 5 years, or more often depending on your overall health. Hepatitis C test. Hepatitis B test. Screening Lung cancer screening. You may have this screening every year starting at age 27 if you have a 30-pack-year history of smoking and currently smoke or have quit within the past 15 years. Colorectal cancer screening. All adults should have this screening starting at age 62 and continuing until age 10. Your health care provider may recommend screening at age  45 if you are at increased risk. You will have tests every 1-10 years, depending on your results and the type of screening test. Prostate cancer screening. Recommendations will vary depending on your family history and other risks. Genital exam to check for testicular cancer or hernias. Diabetes screening. This is done by checking your blood sugar (glucose) after you have not eaten for a while (fasting). You may have this done every 1-3 years. Abdominal aortic aneurysm (AAA) screening. You may need this if you are a current or former smoker. STD (sexually transmitted disease) testing, if you are at risk. Follow these instructions at home: Eating and drinking  Eat a diet that includes fresh fruits and vegetables, whole grains, lean protein, and low-fat dairy products. Limit your intake of foods with high amounts of sugar, saturated fats, and salt. Take vitamin and mineral supplements as recommended by your health care provider. Do not drink alcohol if your health care provider tells you not to drink. If you drink alcohol: Limit how much you have to 0-2 drinks a day. Be aware of how much alcohol is in your drink. In the [place] one drink equals one 12 oz bottle of beer (355 mL), one 5 oz glass of wine (148 mL), or one 1 oz glass of hard liquor (44 mL). Lifestyle Take daily care of your teeth and gums. Brush your teeth every morning and night with fluoride toothpaste. Floss one time each day. Stay active. Exercise for at least  30 minutes 5 or more days each week. Do not use any products that contain nicotine or tobacco, such as cigarettes, e-cigarettes, and chewing tobacco. If you need help quitting, ask your health care provider. Do not use drugs. If you are sexually active, practice safe sex. Use a condom or other form of protection to prevent STIs (sexually transmitted infections). Talk with your health care provider about taking a low-dose aspirin or statin. Find healthy ways to cope with stress, such as: Meditation, yoga, or listening to music. Journaling. Talking to a trusted person. Spending time with friends and family. Safety Always wear your seat belt while driving or riding in a vehicle. Do not drive: If you have been drinking alcohol. Do not ride with someone who has been drinking. When you are tired or distracted. While texting. Wear a helmet and other protective equipment during sports activities. If you have firearms in your house, make sure you follow all gun safety procedures. What's next? Visit your health care provider once a year for an annual wellness visit. Ask your health care provider how often you should have your eyes and teeth checked. Stay up to date on all vaccines. This information is not intended to replace advice given to you by your health care provider. Make sure you discuss any questions you have with your health care provider. Document Revised: 06/02/2020 Document Reviewed: 03/19/2018 Elsevier Patient Education  2022 [place].   Urinary Frequency, Adult Urinary frequency means urinating more often than usual. You may urinate every 1-2 hours even though you drink a normal amount of fluid and do not have a bladder infection or condition. Although you urinate more often than normal, the total amount of urine produced in a day is normal. With urinary frequency, you may have an urgent need to urinate often. The stress and anxiety of needing to find a bathroom quickly can  make this urge worse. This condition may go away on its own or you may need treatment at home. Home treatment  may include bladder training, exercises, taking medicines, or making changes to your diet. Follow these instructions at home: Bladder health  Keep a bladder diary if told by your health care provider. Keep track of: What you eat and drink. How often you urinate. How much you urinate. Follow a bladder training program if told by your health care provider. This may include: Learning to delay going to the bathroom. Double urinating (voiding). This helps if you are not completely emptying your bladder. Scheduled voiding. Do Kegel exercises as told by your health care provider. Kegel exercises strengthen the muscles that help control urination, which may help the condition. Eating and drinking If told by your health care provider, make diet changes, such as: Avoiding caffeine. Drinking fewer fluids, especially alcohol. Not drinking in the evening. Avoiding foods or drinks that may irritate the bladder. These include coffee, tea, soda, artificial sweeteners, citrus, tomato-based foods, and chocolate. Eating foods that help prevent or ease constipation. Constipation can make this condition worse. Your health care provider may recommend that you: Drink enough fluid to keep your urine pale yellow. Take over-the-counter or prescription medicines. Eat foods that are high in fiber, such as beans, whole grains, and fresh fruits and vegetables. Limit foods that are high in fat and processed sugars, such as fried or sweet foods. General instructions Take over-the-counter and prescription medicines only as told by your health care provider. Keep all follow-up visits as told by your health care provider. This is important. Contact a health care provider if: You start urinating more often. You feel pain or irritation when you urinate. You notice blood in your urine. Your urine looks cloudy. You  develop a fever. You begin vomiting. Get help right away if: You are unable to urinate. Summary Urinary frequency means urinating more often than usual. With urinary frequency, you may urinate every 1-2 hours even though you drink a normal amount of fluid and do not have a bladder infection or other bladder condition. Your health care provider may recommend that you keep a bladder diary, follow a bladder training program, or make dietary changes. If told by your health care provider, do Kegel exercises to strengthen the muscles that help control urination. Take over-the-counter and prescription medicines only as told by your health care provider. Contact a health care provider if your symptoms do not improve or get worse. This information is not intended to replace advice given to you by your health care provider. Make sure you discuss any questions you have with your health care provider. Document Revised: 10/02/2017 Document Reviewed: 10/02/2017 Elsevier Patient Education  2021 [place],   [name], MD [place] 01/01/21 3:03 PM

## 2021-01-01 NOTE — Patient Instructions (Addendum)
Call Dr. Vena Rua office to schedule repeat colonoscopy (3 year repeat planned in 12/2017).  [facility] is now offering the bivalent Covid booster vaccine. To schedule an appointment or to find more information, visit [email or web address] You can also call [phone], Monday through Friday, 7 a.m. to 7 p.m.  I will check some tests for the frequent urination, but see information below.  Try to decrease energy drinks.  If the symptoms continue please follow-up for separate appointment.  Follow-up sooner if any worsening of symptoms. Thanks for coming in today.  Preventive Care 13 Years and Older, Male Preventive care refers to lifestyle choices and visits with your health care provider that can promote health and wellness. This includes: A yearly physical exam. This is also called an annual wellness visit. Regular dental and eye exams. Immunizations. Screening for certain conditions. Healthy lifestyle choices, such as: Eating a healthy diet. Getting regular exercise. Not using drugs or products that contain nicotine and tobacco. Limiting alcohol use. What can I expect for my preventive care visit? Physical exam Your health care provider will check your: Height and weight. These may be used to calculate your BMI (body mass index). BMI is a measurement that tells if you are at a healthy weight. Heart rate and blood pressure. Body temperature. Skin for abnormal spots. Counseling Your health care provider may ask you questions about your: Past medical problems. Family's medical history. Alcohol, tobacco, and drug use. Emotional well-being. Home life and relationship well-being. Sexual activity. Diet, exercise, and sleep habits. History of falls. Memory and ability to understand (cognition). Work and work Statistician. Access to firearms. What immunizations do I need? Vaccines are usually given at various ages, according to a schedule. Your health care provider will recommend  vaccines for you based on your age, medical history, and lifestyle or other factors, such as travel or where you work. What tests do I need? Blood tests Lipid and cholesterol levels. These may be checked every 5 years, or more often depending on your overall health. Hepatitis C test. Hepatitis B test. Screening Lung cancer screening. You may have this screening every year starting at age 59 if you have a 30-pack-year history of smoking and currently smoke or have quit within the past 15 years. Colorectal cancer screening. All adults should have this screening starting at age 22 and continuing until age 81. Your health care provider may recommend screening at age 44 if you are at increased risk. You will have tests every 1-10 years, depending on your results and the type of screening test. Prostate cancer screening. Recommendations will vary depending on your family history and other risks. Genital exam to check for testicular cancer or hernias. Diabetes screening. This is done by checking your blood sugar (glucose) after you have not eaten for a while (fasting). You may have this done every 1-3 years. Abdominal aortic aneurysm (AAA) screening. You may need this if you are a current or former smoker. STD (sexually transmitted disease) testing, if you are at risk. Follow these instructions at home: Eating and drinking  Eat a diet that includes fresh fruits and vegetables, whole grains, lean protein, and low-fat dairy products. Limit your intake of foods with high amounts of sugar, saturated fats, and salt. Take vitamin and mineral supplements as recommended by your health care provider. Do not drink alcohol if your health care provider tells you not to drink. If you drink alcohol: Limit how much you have to 0-2 drinks a day. Be aware of how much  alcohol is in your drink. In the [place] one drink equals one 12 oz bottle of beer (355 mL), one 5 oz glass of wine (148 mL), or one 1 oz glass of  hard liquor (44 mL). Lifestyle Take daily care of your teeth and gums. Brush your teeth every morning and night with fluoride toothpaste. Floss one time each day. Stay active. Exercise for at least 30 minutes 5 or more days each week. Do not use any products that contain nicotine or tobacco, such as cigarettes, e-cigarettes, and chewing tobacco. If you need help quitting, ask your health care provider. Do not use drugs. If you are sexually active, practice safe sex. Use a condom or other form of protection to prevent STIs (sexually transmitted infections). Talk with your health care provider about taking a low-dose aspirin or statin. Find healthy ways to cope with stress, such as: Meditation, yoga, or listening to music. Journaling. Talking to a trusted person. Spending time with friends and family. Safety Always wear your seat belt while driving or riding in a vehicle. Do not drive: If you have been drinking alcohol. Do not ride with someone who has been drinking. When you are tired or distracted. While texting. Wear a helmet and other protective equipment during sports activities. If you have firearms in your house, make sure you follow all gun safety procedures. What's next? Visit your health care provider once a year for an annual wellness visit. Ask your health care provider how often you should have your eyes and teeth checked. Stay up to date on all vaccines. This information is not intended to replace advice given to you by your health care provider. Make sure you discuss any questions you have with your health care provider. Document Revised: 06/02/2020 Document Reviewed: 03/19/2018 Elsevier Patient Education  2022 [place].   Urinary Frequency, Adult Urinary frequency means urinating more often than usual. You may urinate every 1-2 hours even though you drink a normal amount of fluid and do not have a bladder infection or condition. Although you urinate more often than  normal, the total amount of urine produced in a day is normal. With urinary frequency, you may have an urgent need to urinate often. The stress and anxiety of needing to find a bathroom quickly can make this urge worse. This condition may go away on its own or you may need treatment at home. Home treatment may include bladder training, exercises, taking medicines, or making changes to your diet. Follow these instructions at home: Bladder health  Keep a bladder diary if told by your health care provider. Keep track of: What you eat and drink. How often you urinate. How much you urinate. Follow a bladder training program if told by your health care provider. This may include: Learning to delay going to the bathroom. Double urinating (voiding). This helps if you are not completely emptying your bladder. Scheduled voiding. Do Kegel exercises as told by your health care provider. Kegel exercises strengthen the muscles that help control urination, which may help the condition. Eating and drinking If told by your health care provider, make diet changes, such as: Avoiding caffeine. Drinking fewer fluids, especially alcohol. Not drinking in the evening. Avoiding foods or drinks that may irritate the bladder. These include coffee, tea, soda, artificial sweeteners, citrus, tomato-based foods, and chocolate. Eating foods that help prevent or ease constipation. Constipation can make this condition worse. Your health care provider may recommend that you: Drink enough fluid to keep your urine pale  yellow. Take over-the-counter or prescription medicines. Eat foods that are high in fiber, such as beans, whole grains, and fresh fruits and vegetables. Limit foods that are high in fat and processed sugars, such as fried or sweet foods. General instructions Take over-the-counter and prescription medicines only as told by your health care provider. Keep all follow-up visits as told by your health care provider.  This is important. Contact a health care provider if: You start urinating more often. You feel pain or irritation when you urinate. You notice blood in your urine. Your urine looks cloudy. You develop a fever. You begin vomiting. Get help right away if: You are unable to urinate. Summary Urinary frequency means urinating more often than usual. With urinary frequency, you may urinate every 1-2 hours even though you drink a normal amount of fluid and do not have a bladder infection or other bladder condition. Your health care provider may recommend that you keep a bladder diary, follow a bladder training program, or make dietary changes. If told by your health care provider, do Kegel exercises to strengthen the muscles that help control urination. Take over-the-counter and prescription medicines only as told by your health care provider. Contact a health care provider if your symptoms do not improve or get worse. This information is not intended to replace advice given to you by your health care provider. Make sure you discuss any questions you have with your health care provider. Document Revised: 10/02/2017 Document Reviewed: 10/02/2017 Elsevier Patient Education  2021 [employer].

## 2021-01-02 LAB — COMPREHENSIVE METABOLIC PANEL
ALT: 12 U/L (ref 0–53)
AST: 19 U/L (ref 0–37)
Albumin: 4.3 g/dL (ref 3.5–5.2)
Alkaline Phosphatase: 62 U/L (ref 39–117)
BUN: 18 mg/dL (ref 6–23)
CO2: 27 mEq/L (ref 19–32)
Calcium: 9.6 mg/dL (ref 8.4–10.5)
Chloride: 99 mEq/L (ref 96–112)
Creatinine, Ser: 0.85 mg/dL (ref 0.40–1.50)
GFR: 82.22 mL/min (ref >60.00)
Glucose, Bld: 86 mg/dL (ref 70–99)
Potassium: 4.3 mEq/L (ref 3.5–5.1)
Sodium: 135 mEq/L (ref 135–145)
Total Bilirubin: 0.4 mg/dL (ref 0.2–1.2)
Total Protein: 8 g/dL (ref 6.0–8.3)

## 2021-01-02 LAB — HEMOGLOBIN A1C: Hgb A1c MFr Bld: 6.1 % (ref 4.6–6.5)

## 2021-01-02 LAB — PSA: PSA: 0.78 ng/mL (ref 0.10–4.00)

## 2021-01-23 ENCOUNTER — Encounter: Payer: Self-pay | Admitting: Family Medicine

## 2021-03-26 ENCOUNTER — Encounter: Payer: Self-pay | Admitting: Family Medicine

## 2021-03-26 ENCOUNTER — Ambulatory Visit: Payer: Medicare PPO | Admitting: Family Medicine

## 2021-03-26 VITALS — BP 128/59 | HR 66 | Temp 98.2°F | Resp 16 | Ht 71.0 in | Wt 190.2 lb

## 2021-03-26 DIAGNOSIS — H6122 Impacted cerumen, left ear: Secondary | ICD-10-CM | POA: Diagnosis not present

## 2021-03-26 DIAGNOSIS — R040 Epistaxis: Secondary | ICD-10-CM

## 2021-03-26 NOTE — Patient Instructions (Addendum)
Try saline nasal spray, and humidifier in room where you sleep. If bleeding is not improving next few weeks, can refer you to ENT if needed.  Earwax Buildup, Adult The ears produce a substance called earwax that helps keep bacteria out of the ear and protects the skin in the ear canal. Occasionally, earwax can build up in the ear and cause discomfort or hearing loss. What are the causes? This condition is caused by a buildup of earwax. Ear canals are self-cleaning. Ear wax is made in the outer part of the ear canal and generally falls out in small amounts over time. When the self-cleaning mechanism is not working, earwax builds up and can cause decreased hearing and discomfort. Attempting to clean ears with cotton swabs can push the earwax deep into the ear canal and cause decreased hearing and pain. What increases the risk? This condition is more likely to develop in people who: Clean their ears often with cotton swabs. Pick at their ears. Use earplugs or in-ear headphones often, or wear hearing aids. The following factors may also make you more likely to develop this condition: Being male. Being of older age. Naturally producing more earwax. Having narrow ear canals. Having earwax that is overly thick or sticky. Having excess hair in the ear canal. Having eczema. Being dehydrated. What are the signs or symptoms? Symptoms of this condition include: Reduced or muffled hearing. A feeling of fullness in the ear or feeling that the ear is plugged. Fluid coming from the ear. Ear pain or an itchy ear. Ringing in the ear. Coughing. Balance problems. An obvious piece of earwax that can be seen inside the ear canal. How is this diagnosed? This condition may be diagnosed based on: Your symptoms. Your medical history. An ear exam. During the exam, your health care provider will look into your ear with an instrument called an otoscope. You may have tests, including a hearing test. How is  this treated? This condition may be treated by: Using ear drops to soften the earwax. Having the earwax removed by a health care provider. The health care provider may: Flush the ear with water. Use an instrument that has a loop on the end (curette). Use a suction device. Having surgery to remove the wax buildup. This may be done in severe cases. Follow these instructions at home:  Take over-the-counter and prescription medicines only as told by your health care provider. Do not put any objects, including cotton swabs, into your ear. You can clean the opening of your ear canal with a washcloth or facial tissue. Follow instructions from your health care provider about cleaning your ears. Do not overclean your ears. Drink enough fluid to keep your urine pale yellow. This will help to thin the earwax. Keep all follow-up visits as told. If earwax builds up in your ears often or if you use hearing aids, consider seeing your health care provider for routine, preventive ear cleanings. Ask your health care provider how often you should schedule your cleanings. If you have hearing aids, clean them according to instructions from the manufacturer and your health care provider. Contact a health care provider if: You have ear pain. You develop a fever. You have pus or other fluid coming from your ear. You have hearing loss. You have ringing in your ears that does not go away. You feel like the room is spinning (vertigo). Your symptoms do not improve with treatment. Get help right away if: You have bleeding from the affected ear.  You have severe ear pain. Summary Earwax can build up in the ear and cause discomfort or hearing loss. The most common symptoms of this condition include reduced or muffled hearing, a feeling of fullness in the ear, or feeling that the ear is plugged. This condition may be diagnosed based on your symptoms, your medical history, and an ear exam. This condition may be treated  by using ear drops to soften the earwax or by having the earwax removed by a health care provider. Do not put any objects, including cotton swabs, into your ear. You can clean the opening of your ear canal with a washcloth or facial tissue. This information is not intended to replace advice given to you by your health care provider. Make sure you discuss any questions you have with your health care provider. Document Revised: 07/13/2019 Document Reviewed: 07/13/2019 Elsevier Patient Education  2022 [place], Adult A nosebleed is when blood comes out of the nose. Nosebleeds are common. Usually, they are not a sign of a serious condition. Nosebleeds can happen if a blood vessel in your nose starts to bleed or if the lining of your nose (mucous membrane) cracks. They are commonly caused by: Allergies. Colds. Picking your nose. Blowing your nose too hard. An injury from sticking an object into your nose or getting hit in the nose. Dry or cold air. Less common causes of nosebleeds include: Toxic fumes. Something abnormal in the nose or in the air-filled spaces in the bones of the face (sinuses). Growths in the nose, such as polyps. Blood thinners or conditions that cause blood to clot slowly. Certain illnesses or procedures that irritate or dry out the nasal passages. Follow these instructions at home: When you have a nosebleed:  Sit down and tilt your head slightly forward. Use a clean towel or tissue to pinch your nostrils under the bony part of your nose. After 5 minutes, let go of your nose and see if bleeding starts again. Do not release pressure before that time. If there is still bleeding, repeat the pinching and holding for 5 minutes or until the bleeding stops. Do not place tissues or gauze in the nose to stop the bleeding. Avoid lying down and avoid tilting your head backward. That may make blood collect in the throat and cause gagging or coughing. Use a nasal  spray decongestant to help with a nosebleed as told by your health care provider. After a nosebleed: Avoid blowing your nose or sniffing for a number of hours. Avoid straining, lifting, or bending at the waist for several days. You may go back to other normal activities as you are able. If you are taking aspirin or blood thinners and you have nosebleeds, talk to your health care provider. These medicines make bleeding more likely. Ask your health care provider if you should stop taking the medicines or if you should adjust the dose. Do not stop taking medicines that your health care provider has recommended unless he or she tells you to stop taking them. If your nosebleed was caused by dry mucous membranes, use over-the-counter saline nasal spray or gel and a humidifier as told by your health care provider. This will keep the mucous membranes moist and allow them to heal. If you need to use one of these products: Choose one that is water-soluble. Use only as much as you need and use it only as often as needed. Do not lie down right after you use it. If  you get nosebleeds often, talk with your health care provider about medical treatments. Options may include: Nasal cautery. This treatment stops and prevents nosebleeds by using a chemical swab or electrical device to lightly burn tiny blood vessels inside the nose. Nasal packing. A gauze or other material is placed in the nose to keep constant pressure on the bleeding area. Contact a health care provider if you: Have a fever. Get nosebleeds often or more often than usual. Bruise very easily. Have a nosebleed from having something stuck in your nose. Have bleeding in your mouth. Vomit or cough up brown material. Have a nosebleed after you start a new medicine. Get help right away if: You have a nosebleed after a fall or a head injury. Your nosebleed does not go away after 20 minutes. You feel dizzy or weak. You have unusual bleeding from  other parts of your body. You have unusual bruising on other parts of your body. You become sweaty. You vomit blood. Summary A nosebleed is when blood comes out of the nose. Common causes include allergies, an injury to the nose, or cold or dry air. Initial treatment includes applying pressure for 5 minutes. Moisturizing the nose with saline nasal spray or gel after a nosebleed may help prevent future bleeding. Get help right away if your nosebleed does not go away after 20 minutes. This information is not intended to replace advice given to you by your health care provider. Make sure you discuss any questions you have with your health care provider. Document Revised: 01/21/2019 Document Reviewed: 01/21/2019 Elsevier Patient Education  2022 [employer].     If you have lab work done today you will be contacted with your lab results within the next 2 weeks.  If you have not heard from [place] then please contact us. The fastest way to get your results is to register for My Chart.   IF you received an x-ray today, you will receive an invoice from [facility]. Please contact [facility] at [phone] with questions or concerns regarding your invoice.   IF you received labwork today, you will receive an invoice from [facility]. Please contact [facility] at [phone] with questions or concerns regarding your invoice.   Our billing staff will not be able to assist you with questions regarding bills from these companies.  You will be contacted with the lab results as soon as they are available. The fastest way to get your results is to activate your My Chart account. Instructions are located on the last page of this paperwork. If you have not heard from [place] regarding the results in 2 weeks, please contact this office.

## 2021-03-26 NOTE — Progress Notes (Signed)
Subjective:  Patient ID: Eric Noble., male    DOB: 07/14/1940  Age: 80 y.o. MRN: [id number]  CC:  Chief Complaint  Patient presents with   Nasal Congestion    Patient states he has been having a cough, runny nose and right nostril will have some blood in it some times. Pt states this is effecting his energy level. He has been taking some OTC Mucinex with no relief.    HPI Eric Ferrell. presents for    Cough, congestion nose bleed.  Cold outside, working outside, 3 weeks ago.no fever, no dyspnea at rest. No covid test or flu testing.  Some nasal congestion, blowing nose more often. Noticed some blood in nasal d/c from the left, but not to point of epistaxis/full nosebleed. Overall congestion has been improving, just some blood from left nostril. No headache, feels well otherwise, still working.  Hearing ok.   History There are no problems to display for this patient.  Past Medical History:  Diagnosis Date   Heart murmur    Nervous stomach    Past Surgical History:  Procedure Laterality Date   COLONOSCOPY     CYSTECTOMY     left leg   POLYPECTOMY     Not on File Prior to Admission medications   Medication Sig Start Date End Date Taking? Authorizing Provider  aspirin 81 MG tablet Take 162 mg by mouth daily.    Yes [provider]  Multiple Vitamins-Minerals (MULTIVITAMIN WITH MINERALS) tablet Take 1 tablet by mouth daily.   Yes [provider]   Social History   Socioeconomic History   Marital status: Married    Spouse name: Not on file   Number of children: Not on file   Years of education: Not on file   Highest education level: Not on file  Occupational History   Occupation: Herbalist  Tobacco Use   Smoking status: Former    Years: 5.00    Types: Cigarettes   Smokeless tobacco: Never   Tobacco comments:    in college  Vaping Use   Vaping Use: Never used  Substance and Sexual Activity   Alcohol use: No    Alcohol/week:  0.0 standard drinks   Drug use: No   Sexual activity: Yes  Other Topics Concern   Not on file  Social History Narrative   Married.   Education: [employer]   Exercise: Yes   Social Determinants of Radio broadcast assistant Strain: Not on file  Food Insecurity: Not on file  Transportation Needs: Not on file  Physical Activity: Not on file  Stress: Not on file  Social Connections: Not on file  Intimate Partner Violence: Not on file    Review of Systems   Objective:   Vitals:   03/26/21 1148  BP: (!) 128/59  Pulse: 66  Resp: 16  Temp: 98.2 F (36.8 C)  TempSrc: Temporal  SpO2: 96%  Weight: 190 lb 3.2 oz (86.3 kg)  Height: 5\' 11"  (1.803 m)     Physical Exam Vitals reviewed.  Constitutional:      Appearance: He is well-developed.  HENT:     Head: Normocephalic and atraumatic.     Right Ear: Tympanic membrane, ear canal and external ear normal.     Left Ear: External ear normal. There is impacted cerumen (thick brown cerumen on left partially removed with curette, residual deep impaction.denies change in hearing.).     Nose: No rhinorrhea.     Mouth/Throat:  Pharynx: No oropharyngeal exudate or posterior oropharyngeal erythema.     Comments: Raw appearance on nasal septum bilat, dried blood. No mass appreciated.  Eyes:     Conjunctiva/sclera: Conjunctivae normal.     Pupils: Pupils are equal, round, and reactive to light.  Cardiovascular:     Rate and Rhythm: Normal rate and regular rhythm.     Heart sounds: Murmur heard.  Pulmonary:     Effort: Pulmonary effort is normal.     Breath sounds: Normal breath sounds. No wheezing, rhonchi or rales.  Abdominal:     Palpations: Abdomen is soft.     Tenderness: There is no abdominal tenderness.  Musculoskeletal:     Cervical back: Neck supple.  Lymphadenopathy:     Cervical: No cervical adenopathy.  Skin:    General: Skin is warm and dry.     Findings: No rash.  Neurological:     Mental Status: He is alert  and oriented to person, place, and time.  Psychiatric:        Behavior: Behavior normal.    Assessment & Plan:  Eric Ferrell. is a 80 y.o. male . Nasal bleeding  -Likely irritated nasal passages from frequent nose blowing.  Saline nasal spray, humidifier, symptomatic care with RTC precautions given.  Handout on epistaxis if that were to occur.  Impacted cerumen of left ear Partially removed with curette, cerumen disimpaction by lavage also performed with relief and no complications.   No orders of the defined types were placed in this encounter.  Patient Instructions   Try saline nasal spray, and humidifier in room where you sleep. If bleeding is not improving next few weeks, can refer you to ENT if needed.   Nosebleed, Adult A nosebleed is when blood comes out of the nose. Nosebleeds are common. Usually, they are not a sign of a serious condition. Nosebleeds can happen if a blood vessel in your nose starts to bleed or if the lining of your nose (mucous membrane) cracks. They are commonly caused by: Allergies. Colds. Picking your nose. Blowing your nose too hard. An injury from sticking an object into your nose or getting hit in the nose. Dry or cold air. Less common causes of nosebleeds include: Toxic fumes. Something abnormal in the nose or in the air-filled spaces in the bones of the face (sinuses). Growths in the nose, such as polyps. Blood thinners or conditions that cause blood to clot slowly. Certain illnesses or procedures that irritate or dry out the nasal passages. Follow these instructions at home: When you have a nosebleed:  Sit down and tilt your head slightly forward. Use a clean towel or tissue to pinch your nostrils under the bony part of your nose. After 5 minutes, let go of your nose and see if bleeding starts again. Do not release pressure before that time. If there is still bleeding, repeat the pinching and holding for 5 minutes or until the bleeding  stops. Do not place tissues or gauze in the nose to stop the bleeding. Avoid lying down and avoid tilting your head backward. That may make blood collect in the throat and cause gagging or coughing. Use a nasal spray decongestant to help with a nosebleed as told by your health care provider. After a nosebleed: Avoid blowing your nose or sniffing for a number of hours. Avoid straining, lifting, or bending at the waist for several days. You may go back to other normal activities as you are able. If you are  taking aspirin or blood thinners and you have nosebleeds, talk to your health care provider. These medicines make bleeding more likely. Ask your health care provider if you should stop taking the medicines or if you should adjust the dose. Do not stop taking medicines that your health care provider has recommended unless he or she tells you to stop taking them. If your nosebleed was caused by dry mucous membranes, use over-the-counter saline nasal spray or gel and a humidifier as told by your health care provider. This will keep the mucous membranes moist and allow them to heal. If you need to use one of these products: Choose one that is water-soluble. Use only as much as you need and use it only as often as needed. Do not lie down right after you use it. If you get nosebleeds often, talk with your health care provider about medical treatments. Options may include: Nasal cautery. This treatment stops and prevents nosebleeds by using a chemical swab or electrical device to lightly burn tiny blood vessels inside the nose. Nasal packing. A gauze or other material is placed in the nose to keep constant pressure on the bleeding area. Contact a health care provider if you: Have a fever. Get nosebleeds often or more often than usual. Bruise very easily. Have a nosebleed from having something stuck in your nose. Have bleeding in your mouth. Vomit or cough up brown material. Have a nosebleed after  you start a new medicine. Get help right away if: You have a nosebleed after a fall or a head injury. Your nosebleed does not go away after 20 minutes. You feel dizzy or weak. You have unusual bleeding from other parts of your body. You have unusual bruising on other parts of your body. You become sweaty. You vomit blood. Summary A nosebleed is when blood comes out of the nose. Common causes include allergies, an injury to the nose, or cold or dry air. Initial treatment includes applying pressure for 5 minutes. Moisturizing the nose with saline nasal spray or gel after a nosebleed may help prevent future bleeding. Get help right away if your nosebleed does not go away after 20 minutes. This information is not intended to replace advice given to you by your health care provider. Make sure you discuss any questions you have with your health care provider. Document Revised: 01/21/2019 Document Reviewed: 01/21/2019 Elsevier Patient Education  2022 [employer].     If you have lab work done today you will be contacted with your lab results within the next 2 weeks.  If you have not heard from [place] then please contact us. The fastest way to get your results is to register for My Chart.   IF you received an x-ray today, you will receive an invoice from [facility]. Please contact [facility] at [phone] with questions or concerns regarding your invoice.   IF you received labwork today, you will receive an invoice from [facility]. Please contact [facility] at [phone] with questions or concerns regarding your invoice.   Our billing staff will not be able to assist you with questions regarding bills from these companies.  You will be contacted with the lab results as soon as they are available. The fastest way to get your results is to activate your My Chart account. Instructions are located on the last page of this paperwork. If you have not heard from [place] regarding the  results in 2 weeks, please contact this office.  Signed,   [name], MD [place], [facility] 03/26/21 12:53 PM

## 2021-10-02 ENCOUNTER — Encounter (HOSPITAL_COMMUNITY): Payer: Self-pay

## 2021-10-02 ENCOUNTER — Emergency Department (HOSPITAL_COMMUNITY)
Admission: EM | Admit: 2021-10-02 | Discharge: 2021-10-02 | Disposition: A | Discharge status: Home / Self Care | Payer: Medicare PPO | Attending: Emergency Medicine | Admitting: Emergency Medicine

## 2021-10-02 ENCOUNTER — Emergency Department (HOSPITAL_COMMUNITY): Payer: Medicare PPO

## 2021-10-02 ENCOUNTER — Other Ambulatory Visit: Payer: Self-pay

## 2021-10-02 DIAGNOSIS — R0602 Shortness of breath: Secondary | ICD-10-CM | POA: Diagnosis present

## 2021-10-02 DIAGNOSIS — R0600 Dyspnea, unspecified: Secondary | ICD-10-CM

## 2021-10-02 DIAGNOSIS — Z7982 Long term (current) use of aspirin: Secondary | ICD-10-CM | POA: Insufficient documentation

## 2021-10-02 LAB — CBC WITH DIFFERENTIAL/PLATELET
Abs Immature Granulocytes: 0.01 10*3/uL (ref 0.00–0.07)
Basophils Absolute: 0 10*3/uL (ref 0.0–0.1)
Basophils Relative: 1 %
Eosinophils Absolute: 0.2 10*3/uL (ref 0.0–0.5)
Eosinophils Relative: 4 %
HCT: 48.2 % (ref 39.0–52.0)
Hemoglobin: 15.2 g/dL (ref 13.0–17.0)
Immature Granulocytes: 0 %
Lymphocytes Relative: 41 %
Lymphs Abs: 2.1 10*3/uL (ref 0.7–4.0)
MCH: 27.8 pg (ref 26.0–34.0)
MCHC: 31.5 g/dL (ref 30.0–36.0)
MCV: 88.1 fL (ref 80.0–100.0)
Monocytes Absolute: 0.6 10*3/uL (ref 0.1–1.0)
Monocytes Relative: 11 %
Neutro Abs: 2.3 10*3/uL (ref 1.7–7.7)
Neutrophils Relative %: 43 %
Platelets: 399 10*3/uL (ref 150–400)
RBC: 5.47 MIL/uL (ref 4.22–5.81)
RDW: 12.3 % (ref 11.5–15.5)
WBC: 5.2 10*3/uL (ref 4.0–10.5)
nRBC: 0 % (ref 0.0–0.2)

## 2021-10-02 LAB — BASIC METABOLIC PANEL
Anion gap: 6 (ref 5–15)
BUN: 15 mg/dL (ref 8–23)
CO2: 30 mmol/L (ref 22–32)
Calcium: 9.8 mg/dL (ref 8.9–10.3)
Chloride: 100 mmol/L (ref 98–111)
Creatinine, Ser: 1 mg/dL (ref 0.61–1.24)
GFR, Estimated: >60 mL/min (ref >60)
Glucose, Bld: 107 mg/dL — ABNORMAL HIGH (ref 70–99)
Potassium: 4.3 mmol/L (ref 3.5–5.1)
Sodium: 136 mmol/L (ref 135–145)

## 2021-10-02 NOTE — ED Triage Notes (Addendum)
Pt reports intermittent trouble breathing since November. Pt also endorses increased fatigue. Denies chest pain and tightness.

## 2022-02-27 ENCOUNTER — Ambulatory Visit (INDEPENDENT_AMBULATORY_CARE_PROVIDER_SITE_OTHER): Payer: Medicare PPO | Admitting: *Deleted

## 2022-02-27 DIAGNOSIS — Z0000 Encounter for general adult medical examination without abnormal findings: Secondary | ICD-10-CM

## 2022-02-27 NOTE — Progress Notes (Signed)
Subjective:   Eric Dortch. is a 81 y.o. male who presents for Medicare Annual/Subsequent preventive examination.  I connected with  Eric Ferrell. on 02/27/22 by a telephone enabled telemedicine application and verified that I am speaking with the correct person using two identifiers.   I discussed the limitations of evaluation and management by telemedicine. The patient expressed understanding and agreed to proceed.  Patient location: home  Provider location: Tele-health-home    Review of Systems     Cardiac Risk Factors include: advanced age (>51mn, >>17women);male gender     Objective:    Today's Vitals   There is no height or weight on file to calculate BMI.     02/27/2022   10:40 AM 10/02/2021    9:51 AM 01/01/2021    1:39 PM 04/22/2019    8:37 AM 02/04/2018    6:22 PM 03/21/2017   11:27 AM 03/14/2016    3:01 PM  Advanced Directives  Does Patient Have a Medical Advance Directive? Yes No Yes Yes No No Yes  Type of Advance Directive Living will  [place]   [place];Out of facility DNR (pink MOST or yellow form)  Does patient want to make changes to medical advance directive?   Yes (Inpatient - patient defers changing a medical advance directive at this time - Information given)    No - Patient declined  Copy of [place] Chart?       No - copy requested  Would patient like information on creating a medical advance directive?  No - Patient declined    Yes (MAU/Ambulatory/Procedural Areas - Information given)     Current Medications (verified) Outpatient Encounter Medications as of 02/27/2022  Medication Sig   aspirin 81 MG tablet Take 162 mg by mouth daily.    Multiple Vitamins-Minerals (MULTIVITAMIN WITH MINERALS) tablet Take 1 tablet by mouth daily.   Facility-Administered Encounter Medications as of 02/27/2022  Medication   0.9 %   sodium chloride infusion    Allergies (verified) Patient has no known allergies.   History: Past Medical History:  Diagnosis Date   Heart murmur    Nervous stomach    Past Surgical History:  Procedure Laterality Date   COLONOSCOPY     CYSTECTOMY     left leg   POLYPECTOMY     Family History  Problem Relation Age of Onset   Stroke Mother    Diabetes Mother    Healthy Father    Diabetes Sister    Healthy Brother    Healthy Maternal Grandmother    Healthy Maternal Grandfather    Healthy Paternal Grandmother    Healthy Paternal Grandfather    Healthy Brother    Healthy Sister    Colon cancer Neg Hx    Esophageal cancer Neg Hx    Prostate cancer Neg Hx    Rectal cancer Neg Hx    Stomach cancer Neg Hx    Social History   Socioeconomic History   Marital status: Married    Spouse name: Not on file   Number of children: Not on file   Years of education: Not on file   Highest education level: Not on file  Occupational History   Occupation: EHerbalist Tobacco Use   Smoking status: Former    Years: 5.00    Types: Cigarettes   Smokeless tobacco: Never   Tobacco comments:  in college  Vaping Use   Vaping Use: Never used  Substance and Sexual Activity   Alcohol use: No    Alcohol/week: 0.0 standard drinks of alcohol   Drug use: No   Sexual activity: Yes  Other Topics Concern   Not on file  Social History Narrative   Married.   Education: [employer]   Exercise: Yes   Social Determinants of Health   Financial Resource Strain: Low Risk  (02/27/2022)   Overall Financial Resource Strain (CARDIA)    Difficulty of Paying Living Expenses: Not hard at all  Food Insecurity: No Food Insecurity (02/27/2022)   Hunger Vital Sign    Worried About Running Out of Food in the Last Year: Never true    Ran Out of Food in the Last Year: Never true  Transportation Needs: No Transportation Needs (02/27/2022)   PRAPARE - Hydrologist  (Medical): No    Lack of Transportation (Non-Medical): No  Physical Activity: Sufficiently Active (02/27/2022)   Exercise Vital Sign    Days of Exercise per Week: 4 days    Minutes of Exercise per Session: 40 min  Stress: No Stress Concern Present (02/27/2022)   [place]    Feeling of Stress : Not at all  Social Connections: [place] (02/27/2022)   Social Connection and Isolation Panel [NHANES]    Frequency of Communication with Friends and Family: More than three times a week    Frequency of Social Gatherings with Friends and Family: Once a week    Attends Religious Services: More than 4 times per year    Active Member of [employer] or Organizations: Yes    Attends Music therapist: More than 4 times per year    Marital Status: Married    Tobacco Counseling Counseling given: Not Answered Tobacco comments: in college   Clinical Intake:  Pre-visit preparation completed: Yes  Pain : No/denies pain     Diabetes: No  How often do you need to have someone help you when you read instructions, pamphlets, or other written materials from your doctor or pharmacy?: 1 - Never  Diabetic?  no  Interpreter Needed?: No  Information entered by :: Leroy Kennedy  LPN   Activities of Daily Living    02/27/2022   10:34 AM  In your present state of health, do you have any difficulty performing the following activities:  Hearing? 0  Vision? 0  Difficulty concentrating or making decisions? 0  Walking or climbing stairs? 0  Dressing or bathing? 0  Doing errands, shopping? 0  Preparing Food and eating ? N  Using the Toilet? N  In the past six months, have you accidently leaked urine? N  Do you have problems with loss of bowel control? N  Managing your Medications? N  Managing your Finances? N  Housekeeping or managing your Housekeeping? N    Patient Care Team: Wendie Agreste, MD as PCP -  General (Family Medicine)  Indicate any recent Medical Services you may have received from other than [facility] providers in the past year (date may be approximate).     Assessment:   This is a routine wellness examination for Eric Ferrell.  Hearing/Vision screen Hearing Screening - Comments:: No trouble hearing Vision Screening - Comments:: Dr. Idolina Primer Up to date  Dietary issues and exercise activities discussed: Current Exercise Habits: Home exercise routine, Type of exercise: walking, Time (Minutes): 40, Frequency (Times/Week): 4, Weekly Exercise (  Minutes/Week): 160, Intensity: Mild   Goals Addressed             This Visit's Progress    Patient Stated       Continue current lifestyle       Depression Screen    02/27/2022   10:38 AM 03/26/2021   11:47 AM 01/01/2021    1:30 PM 04/22/2019    8:16 AM 04/15/2018    1:37 PM 03/21/2017   10:14 AM 03/14/2016    3:05 PM  PHQ 2/9 Scores  PHQ - 2 Score 0 0 0 0 0 0 0  PHQ- 9 Score 0 3         Fall Risk    02/27/2022   10:34 AM 03/26/2021   11:47 AM 01/01/2021    1:30 PM 04/22/2019    8:15 AM 04/15/2018    1:40 PM  [place] in the past year? 0 0 0 0   Number falls in past yr: 0 0 0 0 0  Injury with Fall? 0 0 0 0   Risk for fall due to :  No Fall Risks     Follow up Falls evaluation completed;Education provided;Falls prevention discussed Falls evaluation completed       FALL RISK PREVENTION PERTAINING TO THE HOME:  Any stairs in or around the home? No  If so, are there any without handrails? No  Home free of loose throw rugs in walkways, pet beds, electrical cords, etc? Yes  Adequate lighting in your home to reduce risk of falls? Yes   ASSISTIVE DEVICES UTILIZED TO PREVENT FALLS:  Life alert? No  Use of a cane, walker or w/c? No  Grab bars in the bathroom? Yes  Shower chair or bench in shower? No  Elevated toilet seat or a handicapped toilet? No   TIMED UP AND GO:  Was the test performed? No .    Cognitive  Function:        02/27/2022   10:35 AM 01/01/2021    1:34 PM 04/15/2018    1:39 PM 03/21/2017   11:39 AM  6CIT Screen  What Year? 0 points 0 points 0 points 0 points  What month? 0 points 0 points 0 points 0 points  What time?  0 points 0 points 0 points  Count back from 20 2 points 0 points 0 points 0 points  Months in reverse 0 points 0 points 0 points 0 points  Repeat phrase 0 points 8 points 0 points 0 points  Total Score  8 points 0 points 0 points    Immunizations Immunization History  Administered Date(s) Administered   Fluad Quad(high Dose 65+) 04/22/2019, 01/01/2021, 02/20/2022   Influenza, High Dose Seasonal PF 04/15/2018   Influenza,inj,Quad PF,6+ Mos 03/13/2015, 01/11/2016, 03/21/2017   Influenza-Unspecified 04/08/2013   PFIZER(Purple Top)SARS-COV-2 Vaccination 05/23/2019, 06/15/2019, 01/22/2020, 01/03/2021   Pneumococcal Conjugate-13 03/13/2015   Pneumococcal Polysaccharide-23 04/11/2013   Tdap 03/13/2015   Zoster Recombinat (Shingrix) 10/15/2017, 04/09/2018   Zoster, Live 04/08/2014    TDAP status: Up to date  Flu Vaccine status: Up to date  Pneumococcal vaccine status: Up to date  Covid-19 vaccine status: Information provided on how to obtain vaccines.   Qualifies for Shingles Vaccine? No   Zostavax completed Yes   Shingrix Completed?: Yes  Screening Tests Health Maintenance  Topic Date Due   COVID-19 Vaccine (5 - 2023-24 season) 03/15/2022 (Originally 12/07/2021)   COLONOSCOPY (Pts 45-36yr Insurance coverage will need to be confirmed)  02/28/2023 (Originally 12/29/2020)   Medicare Annual Wellness (AWV)  02/28/2023   Pneumonia Vaccine 75+ Years old  Completed   INFLUENZA VACCINE  Completed   Zoster Vaccines- Shingrix  Completed   HPV VACCINES  Aged Out    Health Maintenance  There are no preventive care reminders to display for this patient.   Colorectal cancer screening: No longer required.   Lung Cancer Screening: (Low Dose CT Chest  recommended if Age 52-80 years, 30 pack-year currently smoking OR have quit w/in 15years.) does not qualify.   Lung Cancer Screening Referral:   Additional Screening:  Hepatitis C Screening: does not qualify;   Vision Screening: Recommended annual ophthalmology exams for early detection of glaucoma and other disorders of the eye. Is the patient up to date with their annual eye exam?  Yes  Who is the provider or what is the name of the office in which the patient attends annual eye exams? [facility] If pt is not established with a provider, would they like to be referred to a provider to establish care? No .   Dental Screening: Recommended annual dental exams for proper oral hygiene  Community Resource Referral / Chronic Care Management: CRR required this visit?  No   CCM required this visit?  No      Plan:     I have personally reviewed and noted the following in the patient's chart:   Medical and social history Use of alcohol, tobacco or illicit drugs  Current medications and supplements including opioid prescriptions. Patient is not currently taking opioid prescriptions. Functional ability and status Nutritional status Physical activity Advanced directives List of other physicians Hospitalizations, surgeries, and ER visits in previous 12 months Vitals Screenings to include cognitive, depression, and falls Referrals and appointments  In addition, I have reviewed and discussed with patient certain preventive protocols, quality metrics, and best practice recommendations. A written personalized care plan for preventive services as well as general preventive health recommendations were provided to patient.     Leroy Kennedy, LPN   [id number]   Nurse Notes:

## 2022-02-27 NOTE — Patient Instructions (Signed)
Mr. Eric Ferrell , Thank you for taking time to come for your Medicare Wellness Visit. I appreciate your ongoing commitment to your health goals. Please review the following plan we discussed and let me know if I can assist you in the future.   These are the goals we discussed:  Goals      Patient Stated     Continue current lifestyle        This is a list of the screening recommended for you and due dates:  Health Maintenance  Topic Date Due   COVID-19 Vaccine (5 - 2023-24 season) 03/15/2022*   Colon Cancer Screening  02/28/2023*   Medicare Annual Wellness Visit  02/28/2023   Pneumonia Vaccine  Completed   Flu Shot  Completed   Zoster (Shingles) Vaccine  Completed   HPV Vaccine  Aged Out  *Topic was postponed. The date shown is not the original due date.    Advanced directives: yes not on file  Conditions/risks identified:   Next appointment: Follow up in one year for your annual wellness visit. 04-10-2022 @ 1:00  Eric Ferrell 65 Years and Older, Male  Preventive care refers to lifestyle choices and visits with your health care provider that can promote health and wellness. What does preventive care include? A yearly physical exam. This is also called an annual well check. Dental exams once or twice a year. Routine eye exams. Ask your health care provider how often you should have your eyes checked. Personal lifestyle choices, including: Daily care of your teeth and gums. Regular physical activity. Eating a healthy diet. Avoiding tobacco and drug use. Limiting alcohol use. Practicing safe sex. Taking low doses of aspirin every day. Taking vitamin and mineral supplements as recommended by your health care provider. What happens during an annual well check? The services and screenings done by your health care provider during your annual well check will depend on your age, overall health, lifestyle risk factors, and family history of disease. Counseling  Your health  care provider may ask you questions about your: Alcohol use. Tobacco use. Drug use. Emotional well-being. Home and relationship well-being. Sexual activity. Eating habits. History of falls. Memory and ability to understand (cognition). Work and work Statistician. Screening  You may have the following tests or measurements: Height, weight, and BMI. Blood pressure. Lipid and cholesterol levels. These may be checked every 5 years, or more frequently if you are over 26 years old. Skin check. Lung cancer screening. You may have this screening every year starting at age 10 if you have a 30-pack-year history of smoking and currently smoke or have quit within the past 15 years. Fecal occult blood test (FOBT) of the stool. You may have this test every year starting at age 14. Flexible sigmoidoscopy or colonoscopy. You may have a sigmoidoscopy every 5 years or a colonoscopy every 10 years starting at age 82. Prostate cancer screening. Recommendations will vary depending on your family history and other risks. Hepatitis C blood test. Hepatitis B blood test. Sexually transmitted disease (STD) testing. Diabetes screening. This is done by checking your blood sugar (glucose) after you have not eaten for a while (fasting). You may have this done every 1-3 years. Abdominal aortic aneurysm (AAA) screening. You may need this if you are a current or former smoker. Osteoporosis. You may be screened starting at age 62 if you are at high risk. Talk with your health care provider about your test results, treatment options, and if necessary, the need  for more tests. Vaccines  Your health care provider may recommend certain vaccines, such as: Influenza vaccine. This is recommended every year. Tetanus, diphtheria, and acellular pertussis (Tdap, Td) vaccine. You may need a Td booster every 10 years. Zoster vaccine. You may need this after age 24. Pneumococcal 13-valent conjugate (PCV13) vaccine. One dose is  recommended after age 77. Pneumococcal polysaccharide (PPSV23) vaccine. One dose is recommended after age 86. Talk to your health care provider about which screenings and vaccines you need and how often you need them. This information is not intended to replace advice given to you by your health care provider. Make sure you discuss any questions you have with your health care provider. Document Released: 04/21/2015 Document Revised: 12/13/2015 Document Reviewed: 01/24/2015 Elsevier Interactive Patient Education  2017 [employer] Prevention in the Home Falls can cause injuries. They can happen to people of all ages. There are many things you can do to make your home safe and to help prevent falls. What can I do on the outside of my home? Regularly fix the edges of walkways and driveways and fix any cracks. Remove anything that might make you trip as you walk through a door, such as a raised step or threshold. Trim any bushes or trees on the path to your home. Use bright outdoor lighting. Clear any walking paths of anything that might make someone trip, such as rocks or tools. Regularly check to see if handrails are loose or broken. Make sure that both sides of any steps have handrails. Any raised decks and porches should have guardrails on the edges. Have any leaves, snow, or ice cleared regularly. Use sand or salt on walking paths during winter. Clean up any spills in your garage right away. This includes oil or grease spills. What can I do in the bathroom? Use night lights. Install grab bars by the toilet and in the tub and shower. Do not use towel bars as grab bars. Use non-skid mats or decals in the tub or shower. If you need to sit down in the shower, use a plastic, non-slip stool. Keep the floor dry. Clean up any water that spills on the floor as soon as it happens. Remove soap buildup in the tub or shower regularly. Attach bath mats securely with double-sided non-slip rug  tape. Do not have throw rugs and other things on the floor that can make you trip. What can I do in the bedroom? Use night lights. Make sure that you have a light by your bed that is easy to reach. Do not use any sheets or blankets that are too big for your bed. They should not hang down onto the floor. Have a firm chair that has side arms. You can use this for support while you get dressed. Do not have throw rugs and other things on the floor that can make you trip. What can I do in the kitchen? Clean up any spills right away. Avoid walking on wet floors. Keep items that you use a lot in easy-to-reach places. If you need to reach something above you, use a strong step stool that has a grab bar. Keep electrical cords out of the way. Do not use floor polish or wax that makes floors slippery. If you must use wax, use non-skid floor wax. Do not have throw rugs and other things on the floor that can make you trip. What can I do with my stairs? Do not leave any items on the stairs.  Make sure that there are handrails on both sides of the stairs and use them. Fix handrails that are broken or loose. Make sure that handrails are as long as the stairways. Check any carpeting to make sure that it is firmly attached to the stairs. Fix any carpet that is loose or worn. Avoid having throw rugs at the top or bottom of the stairs. If you do have throw rugs, attach them to the floor with carpet tape. Make sure that you have a light switch at the top of the stairs and the bottom of the stairs. If you do not have them, ask someone to add them for you. What else can I do to help prevent falls? Wear shoes that: Do not have high heels. Have rubber bottoms. Are comfortable and fit you well. Are closed at the toe. Do not wear sandals. If you use a stepladder: Make sure that it is fully opened. Do not climb a closed stepladder. Make sure that both sides of the stepladder are locked into place. Ask someone to  hold it for you, if possible. Clearly mark and make sure that you can see: Any grab bars or handrails. First and last steps. Where the edge of each step is. Use tools that help you move around (mobility aids) if they are needed. These include: Canes. Walkers. Scooters. Crutches. Turn on the lights when you go into a dark area. Replace any light bulbs as soon as they burn out. Set up your furniture so you have a clear path. Avoid moving your furniture around. If any of your floors are uneven, fix them. If there are any pets around you, be aware of where they are. Review your medicines with your doctor. Some medicines can make you feel dizzy. This can increase your chance of falling. Ask your doctor what other things that you can do to help prevent falls. This information is not intended to replace advice given to you by your health care provider. Make sure you discuss any questions you have with your health care provider. Document Released: 01/19/2009 Document Revised: 08/31/2015 Document Reviewed: 04/29/2014 Elsevier Interactive Patient Education  2017 [employer].

## 2022-04-10 ENCOUNTER — Ambulatory Visit (INDEPENDENT_AMBULATORY_CARE_PROVIDER_SITE_OTHER): Payer: Medicare PPO | Admitting: Family Medicine

## 2022-04-10 ENCOUNTER — Telehealth: Payer: Self-pay | Admitting: Internal Medicine

## 2022-04-10 ENCOUNTER — Encounter: Payer: Self-pay | Admitting: Family Medicine

## 2022-04-10 VITALS — BP 110/68 | HR 75 | Temp 98.2°F | Resp 17 | Ht 71.0 in | Wt 160.2 lb

## 2022-04-10 DIAGNOSIS — Z23 Encounter for immunization: Secondary | ICD-10-CM | POA: Diagnosis not present

## 2022-04-10 DIAGNOSIS — R634 Abnormal weight loss: Secondary | ICD-10-CM

## 2022-04-10 DIAGNOSIS — R5383 Other fatigue: Secondary | ICD-10-CM | POA: Diagnosis not present

## 2022-04-10 DIAGNOSIS — R7303 Prediabetes: Secondary | ICD-10-CM | POA: Diagnosis not present

## 2022-04-10 DIAGNOSIS — I700 Atherosclerosis of aorta: Secondary | ICD-10-CM | POA: Diagnosis not present

## 2022-04-10 DIAGNOSIS — R9389 Abnormal findings on diagnostic imaging of other specified body structures: Secondary | ICD-10-CM

## 2022-04-10 DIAGNOSIS — Z1211 Encounter for screening for malignant neoplasm of colon: Secondary | ICD-10-CM

## 2022-04-10 DIAGNOSIS — Z0001 Encounter for general adult medical examination with abnormal findings: Secondary | ICD-10-CM

## 2022-04-10 DIAGNOSIS — Z0000 Encounter for general adult medical examination without abnormal findings: Secondary | ICD-10-CM

## 2022-04-10 NOTE — Telephone Encounter (Signed)
Office visit, nonurgent for this discussion and we can decide together based on risk/benefit discussion JMP

## 2022-04-10 NOTE — Patient Instructions (Signed)
Prevnar vaccine given today for pneumonia shot.  I will refer you to the lung doctor and heart doctor based on some of the findings from the CT scan last June.  You may have something called interstitial lung disease which can cause some fatigue and some shortness of breath at times.  Lets follow-up in 1 month and we can follow-up to discuss this further as well as review any lab work abnormalities from today.  I have also referred you to gastroenterology to decide if repeat colonoscopy is needed.  Thank you for coming in today.  Return to the clinic or go to the nearest emergency room if any of your symptoms worsen or new symptoms occur.   Preventive Care 28 Years and Older, Male Preventive care refers to lifestyle choices and visits with your health care provider that can promote health and wellness. Preventive care visits are also called wellness exams. What can I expect for my preventive care visit? Counseling During your preventive care visit, your health care provider may ask about your: Medical history, including: Past medical problems. Family medical history. History of falls. Current health, including: Emotional well-being. Home life and relationship well-being. Sexual activity. Memory and ability to understand (cognition). Lifestyle, including: Alcohol, nicotine or tobacco, and drug use. Access to firearms. Diet, exercise, and sleep habits. Work and work Statistician. Sunscreen use. Safety issues such as seatbelt and bike helmet use. Physical exam Your health care provider will check your: Height and weight. These may be used to calculate your BMI (body mass index). BMI is a measurement that tells if you are at a healthy weight. Waist circumference. This measures the distance around your waistline. This measurement also tells if you are at a healthy weight and may help predict your risk of certain diseases, such as type 2 diabetes and high blood pressure. Heart rate and blood  pressure. Body temperature. Skin for abnormal spots. What immunizations do I need?  Vaccines are usually given at various ages, according to a schedule. Your health care provider will recommend vaccines for you based on your age, medical history, and lifestyle or other factors, such as travel or where you work. What tests do I need? Screening Your health care provider may recommend screening tests for certain conditions. This may include: Lipid and cholesterol levels. Diabetes screening. This is done by checking your blood sugar (glucose) after you have not eaten for a while (fasting). Hepatitis C test. Hepatitis B test. HIV (human immunodeficiency virus) test. STI (sexually transmitted infection) testing, if you are at risk. Lung cancer screening. Colorectal cancer screening. Prostate cancer screening. Abdominal aortic aneurysm (AAA) screening. You may need this if you are a current or former smoker. Talk with your health care provider about your test results, treatment options, and if necessary, the need for more tests. Follow these instructions at home: Eating and drinking  Eat a diet that includes fresh fruits and vegetables, whole grains, lean protein, and low-fat dairy products. Limit your intake of foods with high amounts of sugar, saturated fats, and salt. Take vitamin and mineral supplements as recommended by your health care provider. Do not drink alcohol if your health care provider tells you not to drink. If you drink alcohol: Limit how much you have to 0-2 drinks a day. Know how much alcohol is in your drink. In the [place] one drink equals one 12 oz bottle of beer (355 mL), one 5 oz glass of wine (148 mL), or one 1 oz glass of hard liquor (  44 mL). Lifestyle Brush your teeth every morning and night with fluoride toothpaste. Floss one time each day. Exercise for at least 30 minutes 5 or more days each week. Do not use any products that contain nicotine or tobacco. These  products include cigarettes, chewing tobacco, and vaping devices, such as e-cigarettes. If you need help quitting, ask your health care provider. Do not use drugs. If you are sexually active, practice safe sex. Use a condom or other form of protection to prevent STIs. Take aspirin only as told by your health care provider. Make sure that you understand how much to take and what form to take. Work with your health care provider to find out whether it is safe and beneficial for you to take aspirin daily. Ask your health care provider if you need to take a cholesterol-lowering medicine (statin). Find healthy ways to manage stress, such as: Meditation, yoga, or listening to music. Journaling. Talking to a trusted person. Spending time with friends and family. Safety Always wear your seat belt while driving or riding in a vehicle. Do not drive: If you have been drinking alcohol. Do not ride with someone who has been drinking. When you are tired or distracted. While texting. If you have been using any mind-altering substances or drugs. Wear a helmet and other protective equipment during sports activities. If you have firearms in your house, make sure you follow all gun safety procedures. Minimize exposure to UV radiation to reduce your risk of skin cancer. What's next? Visit your health care provider once a year for an annual wellness visit. Ask your health care provider how often you should have your eyes and teeth checked. Stay up to date on all vaccines. This information is not intended to replace advice given to you by your health care provider. Make sure you discuss any questions you have with your health care provider. Document Revised: 09/20/2020 Document Reviewed: 09/20/2020 Elsevier Patient Education  [employer].

## 2022-04-10 NOTE — Telephone Encounter (Signed)
See note below and advise if pt needs to have a colon.

## 2022-04-10 NOTE — Telephone Encounter (Signed)
Patient states that Dr. [name] told him that he was due for a colonoscopy.   Patient is overdue but I informed him due to his age that he no longer requires a colonoscopy.  Patient is requesting to speak to a nurse about this and is wanting our office to send a letter to his pcp to inform them that he no longer needs a colon if this is what Dr. Hilarie Fredrickson decides.   Patient is not having any GI problems.

## 2022-04-10 NOTE — Progress Notes (Signed)
Subjective:  Patient ID: Eric Ferrell., male    DOB: 01-Apr-1941  Age: 82 y.o. MRN: [id number]  CC:  Chief Complaint  Patient presents with   Annual Exam    Annual exam  Pt is having some fatigued states this has been ongoing since Nov      HPI Eric Brooks Buren Kos. presents for Annual Exam PCP, me Ophthalmology, Dr. Idolina Primer macular degeneration.  Gastroenterology Dr. Hilarie Fredrickson  Some fatigue off and on past year. denies chest pain, fevers, unexplained weight loss, night sweats, or new bone pain. Not every day. Does not notice at work. Notes in the morning, some shortness of breath with some nasal congestion. Uses otc mucinex - makes congestion and fatigue better.  No fatigue or CP with work. Sleeping well.   ER visit in June 2023 for same.   No recent change in symptoms.  Sleeping ok. No melena/hematochezia.  Some chronic cough with clear phlegm at times. Occasional trace of blood in nasal d/c.  No current prescription medications. CT chest 10/02/2021 indicated findings compatible with fibrotic interstitial lung disease.  Aortic valve calcifications which can be seen in the setting of aortic sclerosis or stenosis and moderate three-vessel coronary artery calcifications, aortic atherosclerosis.  Plan for follow-up with PCP and pulmonary.Has not seen me in follow-up since the ER visit, and does not have a pulmonologist. No cardiologist.   Prediabetes: Last A1C in 12/2020.  CMP normal at that time.  Weight down 30# since 03/2021.  Has cut back on meat past 6-7 months. Active with walking at work.   Lab Results  Component Value Date   HGBA1C 6.1 01/01/2021   Wt Readings from Last 3 Encounters:  04/10/22 160 lb 4 oz (72.7 kg)  03/26/21 190 lb 3.2 oz (86.3 kg)  01/01/21 193 lb (87.5 kg)         04/10/2022   12:58 PM 02/27/2022   10:38 AM 03/26/2021   11:47 AM 01/01/2021    1:30 PM 04/22/2019    8:16 AM  Depression screen PHQ 2/9  Decreased Interest 0 0 0 0 0  Down, Depressed,  Hopeless 0 0 0 0 0  PHQ - 2 Score 0 0 0 0 0  Altered sleeping 0 0 0    Tired, decreased energy 3 0 3    Change in appetite 0 0 0    Feeling bad or failure about yourself  0 0 0    Trouble concentrating 0 0 0    Moving slowly or fidgety/restless 0 0 0    Suicidal thoughts 0 0 0    PHQ-9 Score 3 0 3    Difficult doing work/chores Somewhat difficult Not difficult at all Not difficult at all      Health Maintenance  Topic Date Due   COLONOSCOPY (Pts 45-78yr Insurance coverage will need to be confirmed)  02/28/2023 (Originally 12/29/2020)   Medicare Annual Wellness (AWV)  02/28/2023   DTaP/Tdap/Td (2 - Td or Tdap) 03/12/2025   Pneumonia Vaccine 82 Years old  Completed   INFLUENZA VACCINE  Completed   Zoster Vaccines- Shingrix  Completed   HPV VACCINES  Aged Out   COVID-19 Vaccine  Discontinued   colonoscopy 2019 multiple polyps, repeat 3 years.  Lab Results  Component Value Date   PSA1 1.1 04/15/2018   PSA1 1.3 03/14/2016   PSA 0.78 01/01/2021   PSA 0.88 03/13/2015   PSA 0.64 04/11/2013    Immunization History  Administered Date(s) Administered   Fluad Quad(high  Dose 65+) 04/22/2019, 01/01/2021, 02/20/2022   Influenza, High Dose Seasonal PF 04/15/2018   Influenza,inj,Quad PF,6+ Mos 03/13/2015, 01/11/2016, 03/21/2017   Influenza-Unspecified 04/08/2013   PFIZER(Purple Top)SARS-COV-2 Vaccination 05/23/2019, 06/15/2019, 01/22/2020, 01/03/2021   Pneumococcal Conjugate-13 03/13/2015   Pneumococcal Polysaccharide-23 04/11/2013   Tdap 03/13/2015   Zoster Recombinat (Shingrix) 10/15/2017, 04/09/2018   Zoster, Live 04/08/2014  Covid booster - past few months.   No results found. Optho in past year  Dental:every 6 months.   Alcohol: none  Tobacco: none  Exercise:active with work.    History There are no problems to display for this patient.  Past Medical History:  Diagnosis Date   Heart murmur    Nervous stomach    Past Surgical History:  Procedure Laterality Date    COLONOSCOPY     CYSTECTOMY     left leg   POLYPECTOMY     No Known Allergies Prior to Admission medications   Medication Sig Start Date End Date Taking? Authorizing Provider  aspirin 81 MG tablet Take 162 mg by mouth daily.    Yes [provider]  Multiple Vitamins-Minerals (MULTIVITAMIN WITH MINERALS) tablet Take 1 tablet by mouth daily.   Yes [provider]   Social History   Socioeconomic History   Marital status: Married    Spouse name: Not on file   Number of children: Not on file   Years of education: Not on file   Highest education level: Not on file  Occupational History   Occupation: Herbalist  Tobacco Use   Smoking status: Former    Years: 5.00    Types: Cigarettes   Smokeless tobacco: Never   Tobacco comments:    in college  Vaping Use   Vaping Use: Never used  Substance and Sexual Activity   Alcohol use: No    Alcohol/week: 0.0 standard drinks of alcohol   Drug use: No   Sexual activity: Yes  Other Topics Concern   Not on file  Social History Narrative   Married.   Education: [employer]   Exercise: Yes   Social Determinants of Health   Financial Resource Strain: Low Risk  (02/27/2022)   Overall Financial Resource Strain (CARDIA)    Difficulty of Paying Living Expenses: Not hard at all  Food Insecurity: No Food Insecurity (02/27/2022)   Hunger Vital Sign    Worried About Running Out of Food in the Last Year: Never true    Ran Out of Food in the Last Year: Never true  Transportation Needs: No Transportation Needs (02/27/2022)   PRAPARE - Hydrologist (Medical): No    Lack of Transportation (Non-Medical): No  Physical Activity: Sufficiently Active (02/27/2022)   Exercise Vital Sign    Days of Exercise per Week: 4 days    Minutes of Exercise per Session: 40 min  Stress: No Stress Concern Present (02/27/2022)   [place]     Feeling of Stress : Not at all  Social Connections: [place] (02/27/2022)   Social Connection and Isolation Panel [NHANES]    Frequency of Communication with Friends and Family: More than three times a week    Frequency of Social Gatherings with Friends and Family: Once a week    Attends Religious Services: More than 4 times per year    Active Member of [employer] or Organizations: Yes    Attends Archivist Meetings: More than 4 times per year    Marital  Status: Married  Human resources officer Violence: Not At Risk (02/27/2022)   Humiliation, Afraid, Rape, and Kick questionnaire    Fear of Current or Ex-Partner: No    Emotionally Abused: No    Physically Abused: No    Sexually Abused: No    Review of Systems  13 point review of systems per patient health survey noted.  Negative other than as indicated above or in HPI.   Objective:   Vitals:   04/10/22 1259  BP: 110/68  Pulse: 75  Resp: 17  Temp: 98.2 F (36.8 C)  TempSrc: Temporal  SpO2: 95%  Weight: 160 lb 4 oz (72.7 kg)  Height: '5\' 11"'$  (1.803 m)     Physical Exam Vitals reviewed.  Constitutional:      Appearance: He is well-developed.  HENT:     Head: Normocephalic and atraumatic.     Right Ear: External ear normal.     Left Ear: External ear normal.  Eyes:     Conjunctiva/sclera: Conjunctivae normal.     Pupils: Pupils are equal, round, and reactive to light.  Neck:     Thyroid: No thyromegaly.  Cardiovascular:     Rate and Rhythm: Normal rate and regular rhythm.     Heart sounds: Murmur (sem) heard.  Pulmonary:     Effort: No respiratory distress.     Breath sounds: Rales (coarse sounds inferiorly.) present. No wheezing.  Abdominal:     General: There is no distension.     Palpations: Abdomen is soft.     Tenderness: There is no abdominal tenderness.  Musculoskeletal:        General: No tenderness. Normal range of motion.     Cervical back: Normal range of motion and neck supple.   Lymphadenopathy:     Cervical: No cervical adenopathy.  Skin:    General: Skin is warm and dry.  Neurological:     Mental Status: He is alert and oriented to person, place, and time.     Deep Tendon Reflexes: Reflexes are normal and symmetric.  Psychiatric:        Mood and Affect: Mood normal.        Behavior: Behavior normal.     Assessment & Plan:  Eric Ferrell. is a 82 y.o. male . Annual physical exam   --anticipatory guidance as below in AVS, screening labs above. Health maintenance items as above in HPI discussed/recommended as applicable.   Prediabetes - Plan: Comprehensive metabolic panel, Hemoglobin A1c, Lipid panel Weight decreased as above, reports intentional weight loss with dietary changes.  Check updated labs.  Fatigue, unspecified type - Plan: CBC, TSH, Ambulatory referral to Cardiology Aortic calcification ([place]) - Plan: Ambulatory referral to Cardiology  Chronic heart murmur, but with, intermittent dyspnea (likely related to fibrotic interstitial lung disease on CT as above), calcifications of aortic valve, question aortic stenosis/sclerosis.  Refer to cardiology, likely plan for echo.  Weight loss - Plan: Ambulatory referral to Pulmonology Abnormal CT of the chest - Plan: Ambulatory referral to Pulmonology  -As above, intermittent cough, some weight loss, some intermittent dyspnea in the morning primarily.  CT last year indicated fibrotic interstitial lung disease likely, refer to pulmonary to discuss further.  Follow-up with me in the next 1 month to review fatigue and recheck with RTC/ER precautions if worsening.  Need for pneumococcal vaccine - Plan: Pneumococcal conjugate vaccine 20-valent (Prevnar 20)-updated pneumonia vaccine given, especially with possible interstitial lung disease as above.  Screen for colon cancer - Plan: Ambulatory  referral to Gastroenterology-refer to GI to decide on repeat colonoscopy as last testing indicated 3-year follow-up.   No  orders of the defined types were placed in this encounter.  Patient Instructions  Prevnar vaccine given today for pneumonia shot.  I will refer you to the lung doctor and heart doctor based on some of the findings from the CT scan last June.  You may have something called interstitial lung disease which can cause some fatigue and some shortness of breath at times.  Lets follow-up in 1 month and we can follow-up to discuss this further as well as review any lab work abnormalities from today.  I have also referred you to gastroenterology to decide if repeat colonoscopy is needed.  Thank you for coming in today.  Return to the clinic or go to the nearest emergency room if any of your symptoms worsen or new symptoms occur.   Preventive Care 73 Years and Older, Male Preventive care refers to lifestyle choices and visits with your health care provider that can promote health and wellness. Preventive care visits are also called wellness exams. What can I expect for my preventive care visit? Counseling During your preventive care visit, your health care provider may ask about your: Medical history, including: Past medical problems. Family medical history. History of falls. Current health, including: Emotional well-being. Home life and relationship well-being. Sexual activity. Memory and ability to understand (cognition). Lifestyle, including: Alcohol, nicotine or tobacco, and drug use. Access to firearms. Diet, exercise, and sleep habits. Work and work Statistician. Sunscreen use. Safety issues such as seatbelt and bike helmet use. Physical exam Your health care provider will check your: Height and weight. These may be used to calculate your BMI (body mass index). BMI is a measurement that tells if you are at a healthy weight. Waist circumference. This measures the distance around your waistline. This measurement also tells if you are at a healthy weight and may help predict your risk of  certain diseases, such as type 2 diabetes and high blood pressure. Heart rate and blood pressure. Body temperature. Skin for abnormal spots. What immunizations do I need?  Vaccines are usually given at various ages, according to a schedule. Your health care provider will recommend vaccines for you based on your age, medical history, and lifestyle or other factors, such as travel or where you work. What tests do I need? Screening Your health care provider may recommend screening tests for certain conditions. This may include: Lipid and cholesterol levels. Diabetes screening. This is done by checking your blood sugar (glucose) after you have not eaten for a while (fasting). Hepatitis C test. Hepatitis B test. HIV (human immunodeficiency virus) test. STI (sexually transmitted infection) testing, if you are at risk. Lung cancer screening. Colorectal cancer screening. Prostate cancer screening. Abdominal aortic aneurysm (AAA) screening. You may need this if you are a current or former smoker. Talk with your health care provider about your test results, treatment options, and if necessary, the need for more tests. Follow these instructions at home: Eating and drinking  Eat a diet that includes fresh fruits and vegetables, whole grains, lean protein, and low-fat dairy products. Limit your intake of foods with high amounts of sugar, saturated fats, and salt. Take vitamin and mineral supplements as recommended by your health care provider. Do not drink alcohol if your health care provider tells you not to drink. If you drink alcohol: Limit how much you have to 0-2 drinks a day. Know how much alcohol  is in your drink. In the [place] one drink equals one 12 oz bottle of beer (355 mL), one 5 oz glass of wine (148 mL), or one 1 oz glass of hard liquor (44 mL). Lifestyle Brush your teeth every morning and night with fluoride toothpaste. Floss one time each day. Exercise for at least 30 minutes 5 or  more days each week. Do not use any products that contain nicotine or tobacco. These products include cigarettes, chewing tobacco, and vaping devices, such as e-cigarettes. If you need help quitting, ask your health care provider. Do not use drugs. If you are sexually active, practice safe sex. Use a condom or other form of protection to prevent STIs. Take aspirin only as told by your health care provider. Make sure that you understand how much to take and what form to take. Work with your health care provider to find out whether it is safe and beneficial for you to take aspirin daily. Ask your health care provider if you need to take a cholesterol-lowering medicine (statin). Find healthy ways to manage stress, such as: Meditation, yoga, or listening to music. Journaling. Talking to a trusted person. Spending time with friends and family. Safety Always wear your seat belt while driving or riding in a vehicle. Do not drive: If you have been drinking alcohol. Do not ride with someone who has been drinking. When you are tired or distracted. While texting. If you have been using any mind-altering substances or drugs. Wear a helmet and other protective equipment during sports activities. If you have firearms in your house, make sure you follow all gun safety procedures. Minimize exposure to UV radiation to reduce your risk of skin cancer. What's next? Visit your health care provider once a year for an annual wellness visit. Ask your health care provider how often you should have your eyes and teeth checked. Stay up to date on all vaccines. This information is not intended to replace advice given to you by your health care provider. Make sure you discuss any questions you have with your health care provider. Document Revised: 09/20/2020 Document Reviewed: 09/20/2020 Elsevier Patient Education  [place],   [name], MD [place] 04/10/22 2:27 PM

## 2022-04-10 NOTE — Telephone Encounter (Signed)
Spoke with pt and he is aware. Pt scheduled to see Dr. Hilarie Fredrickson tomorrow at 8:50am. Pt aware of appt.

## 2022-04-11 ENCOUNTER — Ambulatory Visit: Payer: Medicare PPO | Admitting: Internal Medicine

## 2022-04-11 ENCOUNTER — Encounter: Payer: Self-pay | Admitting: Internal Medicine

## 2022-04-11 VITALS — BP 110/64 | HR 86 | Ht 71.0 in | Wt 162.1 lb

## 2022-04-11 DIAGNOSIS — R9389 Abnormal findings on diagnostic imaging of other specified body structures: Secondary | ICD-10-CM | POA: Diagnosis not present

## 2022-04-11 DIAGNOSIS — R918 Other nonspecific abnormal finding of lung field: Secondary | ICD-10-CM

## 2022-04-11 DIAGNOSIS — R0602 Shortness of breath: Secondary | ICD-10-CM | POA: Diagnosis not present

## 2022-04-11 DIAGNOSIS — R011 Cardiac murmur, unspecified: Secondary | ICD-10-CM | POA: Diagnosis not present

## 2022-04-11 DIAGNOSIS — R0609 Other forms of dyspnea: Secondary | ICD-10-CM

## 2022-04-11 DIAGNOSIS — Z86010 Personal history of colonic polyps: Secondary | ICD-10-CM | POA: Diagnosis not present

## 2022-04-11 LAB — COMPREHENSIVE METABOLIC PANEL
ALT: 8 U/L (ref 0–53)
AST: 18 U/L (ref 0–37)
Albumin: 4 g/dL (ref 3.5–5.2)
Alkaline Phosphatase: 60 U/L (ref 39–117)
BUN: 21 mg/dL (ref 6–23)
CO2: 30 mEq/L (ref 19–32)
Calcium: 9.9 mg/dL (ref 8.4–10.5)
Chloride: 97 mEq/L (ref 96–112)
Creatinine, Ser: 0.78 mg/dL (ref 0.40–1.50)
GFR: 83.63 mL/min (ref >60.00)
Glucose, Bld: 100 mg/dL — ABNORMAL HIGH (ref 70–99)
Potassium: 4.9 mEq/L (ref 3.5–5.1)
Sodium: 135 mEq/L (ref 135–145)
Total Bilirubin: 0.3 mg/dL (ref 0.2–1.2)
Total Protein: 8.6 g/dL — ABNORMAL HIGH (ref 6.0–8.3)

## 2022-04-11 LAB — CBC
HCT: 41.1 % (ref 39.0–52.0)
Hemoglobin: 13.6 g/dL (ref 13.0–17.0)
MCHC: 33.2 g/dL (ref 30.0–36.0)
MCV: 86.6 fl (ref 78.0–100.0)
Platelets: 451 10*3/uL — ABNORMAL HIGH (ref 150.0–400.0)
RBC: 4.75 Mil/uL (ref 4.22–5.81)
RDW: 13.7 % (ref 11.5–15.5)
WBC: 6.2 10*3/uL (ref 4.0–10.5)

## 2022-04-11 LAB — LIPID PANEL
Cholesterol: 167 mg/dL (ref 0–200)
HDL: 42.9 mg/dL (ref >39.00)
LDL Cholesterol: 102 mg/dL — ABNORMAL HIGH (ref 0–99)
NonHDL: 123.93
Total CHOL/HDL Ratio: 4
Triglycerides: 110 mg/dL (ref 0.0–149.0)
VLDL: 22 mg/dL (ref 0.0–40.0)

## 2022-04-11 LAB — TSH: TSH: 1.06 u[IU]/mL (ref 0.35–5.50)

## 2022-04-11 LAB — HEMOGLOBIN A1C: Hgb A1c MFr Bld: 6 % (ref 4.6–6.5)

## 2022-04-11 NOTE — Patient Instructions (Addendum)
If you are age 82 or younger, your body mass index should be between 19-25. Your Body mass index is 22.61 kg/m. If this is out of the aformentioned range listed, please consider follow up with your Primary Care Provider.  ________________________________________________________  The [facility] providers would like to encourage you to use [facility] to communicate with providers for non-urgent requests or questions.  Due to long hold times on the telephone, sending your provider a message by [facility] may be a faster and more efficient way to get a response.  Please allow 48 business hours for a response.  Please remember that this is for non-urgent requests.  _______________________________________________________  Dennis Bast will follow up in our office on an as needed basis.  Thank you for entrusting me with your care and choosing [facility].  Dr Hilarie Fredrickson

## 2022-04-11 NOTE — Progress Notes (Signed)
Patient ID: Eric Sopko., male   DOB: [birth year], 82 y.o.   MRN: [id number] HPI: Eric Ferrell is an 82 year old male with a history of multiple adenomatous and sessile serrated colon polyps, macular degeneration who is seen to discuss surveillance colonoscopy.  He is here alone today.  He is known to me from previous colonoscopies.  His last surveillance colonoscopy was 12/29/2017.  This revealed 3 subcentimeter adenomas and small internal hemorrhoids.  I colonoscopy in 2016 he had 8 adenomatous and sessile serrated polyps removed.  3-year recall interval was recommended after the 2019 exam.  He returns today stating that he was told by Dr. Carlota Raspberry we should discuss surveillance colonoscopy.  He denies specific GI complaint.  He has lost 30 pounds but he states that this is very intentional with the diet change.  He is eating much less carbohydrates and he stopped eating animal protein.  This was his decision based on animal rights.  He is not having abdominal pain.  No blood in stool or melena.  No diarrhea or constipation.  Bowel movements regular.  He denies nausea, vomiting, early satiety.  No heartburn, dysphagia or odynophagia.  Over the last 1 year and slightly longer he has noticed increasing dyspnea.  This is worse with exertion.  It got to a point in the summer 2023 when he went to the ER for this symptom.  A CT scan was done which were suggestive of pulmonary fibrosis.  He is recently discussed this with Dr. Carlota Raspberry and will be referred to pulmonary.  He denies chest pain.  He is still able to work.  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 Former cigarette smoker, many many years ago.  None now.  No alcohol.    Past Medical History:  Diagnosis Date   Heart murmur    Nervous stomach     Past Surgical History:  Procedure Laterality Date   COLONOSCOPY     CYSTECTOMY     left leg   POLYPECTOMY      Outpatient Medications Prior to Visit   Medication Sig Dispense Refill   aspirin 81 MG tablet Take 162 mg by mouth daily.      Multiple Vitamins-Minerals (MULTIVITAMIN WITH MINERALS) tablet Take 1 tablet by mouth daily.     Facility-Administered Medications Prior to Visit  Medication Dose Route Frequency Provider Last Rate Last Admin   0.9 %  sodium chloride infusion  500 mL Intravenous Once Tria Noguera, Lajuan Lines, MD        No Known Allergies  Family History  Problem Relation Age of Onset   Stroke Mother    Diabetes Mother    Healthy Father    Diabetes Sister    Healthy Brother    Healthy Maternal Grandmother    Healthy Maternal Grandfather    Healthy Paternal Grandmother    Healthy Paternal Grandfather    Healthy Brother    Healthy Sister    Colon cancer Neg Hx    Esophageal cancer Neg Hx    Prostate cancer Neg Hx    Rectal cancer Neg Hx    Stomach cancer Neg Hx     Social History   Tobacco Use   Smoking status: Former    Years: 5.00    Types: Cigarettes   Smokeless tobacco: Never   Tobacco comments:    in college  Vaping Use   Vaping Use: Never used  Substance Use Topics   Alcohol use: No  Alcohol/week: 0.0 standard drinks of alcohol   Drug use: No    ROS: As per history of present illness, otherwise negative  BP 110/64 (BP Location: Left Arm, Patient Position: Sitting, Cuff Size: Normal)   Pulse 86   Ht _0  (1.803 m)   Wt 162 lb 2 oz (73.5 kg)   SpO2 97%   BMI 22.61 kg/m  Gen: awake, alert, NAD HEENT: anicteric, op clear CV: RRR, 4/6 SEM Pulm: Distant with fine crackles bilaterally Abd: soft, NT/ND, +BS throughout Ext: no c/c/e Neuro: nonfocal  RELEVANT LABS AND IMAGING: CBC    Component Value Date/Time   WBC 6.2 04/10/2022 1438   RBC 4.75 04/10/2022 1438   HGB 13.6 04/10/2022 1438   HCT 41.1 04/10/2022 1438   PLT 451.0 (H) 04/10/2022 1438   MCV 86.6 04/10/2022 1438   MCV 91.9 04/11/2013 0939   MCH 27.8 10/02/2021 1005   MCHC 33.2 04/10/2022 1438   RDW 13.7 04/10/2022 1438    LYMPHSABS 2.1 10/02/2021 1005   MONOABS 0.6 10/02/2021 1005   EOSABS 0.2 10/02/2021 1005   BASOSABS 0.0 10/02/2021 1005    CMP     Component Value Date/Time   NA 135 04/10/2022 1438   NA 140 04/22/2019 0907   K 4.9 04/10/2022 1438   CL 97 04/10/2022 1438   CO2 30 04/10/2022 1438   GLUCOSE 100 (H) 04/10/2022 1438   BUN 21 04/10/2022 1438   BUN 15 04/22/2019 0907   CREATININE 0.78 04/10/2022 1438   CREATININE 0.96 01/11/2016 1022   CALCIUM 9.9 04/10/2022 1438   PROT 8.6 (H) 04/10/2022 1438   PROT 7.5 04/22/2019 0907   ALBUMIN 4.0 04/10/2022 1438   ALBUMIN 4.6 04/22/2019 0907   AST 18 04/10/2022 1438   ALT 8 04/10/2022 1438   ALKPHOS 60 04/10/2022 1438   BILITOT 0.3 04/10/2022 1438   BILITOT 0.3 04/22/2019 0907   GFRNONAA >60 10/02/2021 1005   GFRNONAA 77 01/11/2016 1022   GFRAA 93 04/22/2019 0907   GFRAA 89 01/11/2016 1022   CT CHEST WITHOUT CONTRAST   TECHNIQUE: Multidetector CT imaging of the chest was performed following the standard protocol without IV contrast.   RADIATION DOSE REDUCTION: This exam was performed according to the departmental dose-optimization program which includes automated exposure control, adjustment of the mA and/or kV according to patient size and/or use of iterative reconstruction technique.   COMPARISON:  None Available.   FINDINGS: Cardiovascular: Normal heart size. No pericardial effusion. Moderate three-vessel coronary artery calcifications. Calcifications of the aortic valve. Atherosclerotic disease of the thoracic aorta.   Mediastinum/Nodes: Thyroid and esophagus are unremarkable. Mildly enlarged mediastinal lymph nodes which are likely reactive. Reference right lower paratracheal lymph node measuring 1.4 cm in short axis on series 2, image 54.   Lungs/Pleura: Central airways are patent. Peribronchovascular and subpleural reticular and ground-glass opacities with associated traction bronchiectasis and honeycomb change of the  upper lungs. Findings overall have a slight mid and lower lung predominance. No consolidation, pleural effusion or pneumothorax.   Upper Abdomen: Low-attenuation lesion of the right lobe of the liver which is likely a simple cysts.   Musculoskeletal: No chest wall mass or suspicious bone lesions identified.   IMPRESSION: 1. Findings compatible with fibrotic interstitial lung disease, recommend pulmonary consultation and dedicated ILD protocol CT for further characterization. 2. Aortic valve calcifications which can be seen in the setting of aortic sclerosis or stenosis. Recommend echocardiography for further evaluation. 3. Moderate three-vessel coronary artery calcifications. 4.  Aortic  Atherosclerosis (ICD10-I70.0).     Electronically Signed   By: Yetta Glassman M.D.   On: 10/02/2021 12:35  ASSESSMENT/PLAN: 82 year old male with a history of multiple adenomatous and sessile serrated colon polyps, macular degeneration who is seen to discuss surveillance colonoscopy.   History of colon polyps --he has had greater than 10 adenomas/SSP since 2016.  Surveillance colonoscopy would be reasonable at a 3-year interval however given his dyspnea, abnormal chest CT with concern for interstitial lung disease as well as systolic ejection murmur with aortic valve calcifications and three-vessel CAD I feel that colonoscopy should be deferred for now.  I discussed this with him in detail today and he is understanding.  If he is seen by pulmonary and cardiology and felt appropriate for MAC for colonoscopy I am happy to rediscuss this procedure with him. -- Surveillance colonoscopy deferred for now but reasonable to reconsider pending pulmonary and cardiac evaluation  2.  Dyspnea --interstitial lung disease suggested by CT scan in June of last year.  He is being referred by Dr. Nyoka Cowden to pulmonary and I expect will get high-resolution CT scan of the chest.  3.  Systolic ejection murmur --aortic valve  calcifications seen by previous CT scan as well as moderate three-vessel coronary calcifications.  Cardiology evaluation and echocardiogram also reasonable.      DE:YCXKGY, Ranell Patrick, Md [address],  [place] [address]

## 2022-04-30 ENCOUNTER — Other Ambulatory Visit (INDEPENDENT_AMBULATORY_CARE_PROVIDER_SITE_OTHER): Payer: Medicare PPO

## 2022-04-30 ENCOUNTER — Encounter: Payer: Self-pay | Admitting: Internal Medicine

## 2022-04-30 ENCOUNTER — Ambulatory Visit: Payer: Medicare PPO | Admitting: Internal Medicine

## 2022-04-30 VITALS — BP 116/64 | HR 84 | Temp 97.9°F | Ht 70.0 in | Wt 156.4 lb

## 2022-04-30 DIAGNOSIS — R0609 Other forms of dyspnea: Secondary | ICD-10-CM | POA: Diagnosis not present

## 2022-04-30 DIAGNOSIS — R053 Chronic cough: Secondary | ICD-10-CM

## 2022-04-30 LAB — CBC WITH DIFFERENTIAL/PLATELET
Basophils Absolute: 0.1 10*3/uL (ref 0.0–0.1)
Basophils Relative: 0.9 % (ref 0.0–3.0)
Eosinophils Absolute: 0.2 10*3/uL (ref 0.0–0.7)
Eosinophils Relative: 2.5 % (ref 0.0–5.0)
HCT: 42.9 % (ref 39.0–52.0)
Hemoglobin: 13.8 g/dL (ref 13.0–17.0)
Lymphocytes Relative: 24.7 % (ref 12.0–46.0)
Lymphs Abs: 1.6 10*3/uL (ref 0.7–4.0)
MCHC: 32.3 g/dL (ref 30.0–36.0)
MCV: 87.9 fl (ref 78.0–100.0)
Monocytes Absolute: 0.6 10*3/uL (ref 0.1–1.0)
Monocytes Relative: 9.4 % (ref 3.0–12.0)
Neutro Abs: 3.9 10*3/uL (ref 1.4–7.7)
Neutrophils Relative %: 62.5 % (ref 43.0–77.0)
Platelets: 450 10*3/uL — ABNORMAL HIGH (ref 150.0–400.0)
RBC: 4.88 Mil/uL (ref 4.22–5.81)
RDW: 13.9 % (ref 11.5–15.5)
WBC: 6.3 10*3/uL (ref 4.0–10.5)

## 2022-04-30 LAB — POCT EXHALED NITRIC OXIDE: FeNO level (ppb): 35

## 2022-04-30 LAB — SEDIMENTATION RATE: Sed Rate: 71 mm/hr — ABNORMAL HIGH (ref 0–20)

## 2022-04-30 MED ORDER — PANTOPRAZOLE SODIUM 40 MG PO TBEC: 40.0000 mg | DELAYED_RELEASE_TABLET | Freq: Every day | ORAL | 2 refills | Status: DC

## 2022-04-30 MED ORDER — FAMOTIDINE 20 MG PO TABS: ORAL_TABLET | ORAL | 11 refills | Status: DC

## 2022-04-30 MED ORDER — PREDNISONE 10 MG PO TABS: ORAL_TABLET | ORAL | 0 refills | Status: DC

## 2022-04-30 NOTE — Patient Instructions (Addendum)
Pantoprazole (protonix) 40 mg   Take  30-60 min before first meal of the day and Pepcid (famotidine)  20 mg after supper until return to office - this is the best way to tell whether stomach acid is contributing to your problem.    For nasal congestion suggest afrin (5 days only)  before nasort [place] twice daily - both over the counter - if wait 5 days can do afrrin another 5 days or alternate sides 5 days each to prevent rebound congestion  GERD (REFLUX)  is an extremely common cause of respiratory symptoms just like yours , many times with no obvious heartburn at all.    It can be treated with medication, but also with lifestyle changes including elevation of the head of your bed (ideally with 6 -8inch blocks under the headboard of your bed),  Smoking cessation, avoidance of late meals, excessive alcohol, and avoid fatty foods, chocolate, peppermint, colas, red wine, and acidic juices such as orange juice.  NO MINT OR MENTHOL PRODUCTS SO NO COUGH DROPS  USE SUGARLESS CANDY INSTEAD (Jolley ranchers or Stover's or Life Savers) or even ice chips will also do - the key is to swallow to prevent all throat clearing. NO OIL BASED VITAMINS - use powdered substitutes.  Avoid fish oil when coughing.   My office will be contacting you by phone for referral to CT chest/sinus  for 1st week in March   - if you don't hear back from my office within one week please call us back or notify us thru MyChart and we'll address it right away.   Make sure you check your oxygen saturation at your highest level of activity(NOT after you stop)  to be sure it stays over 90% and keep track of it at least once a week, more often if breathing getting worse, and let me know if losing ground. (Collect the dots to connect the dots approach)    Please remember to go to the lab department   for your tests - we will call you with the results when they are available.      Please schedule a follow up office visit in 6 weeks, call sooner  if needed with pfts on return   Add: due to elevated  FENO rec  try Prednisone 10 mg take  4 each am x 2 days,   2 each am x 2 days,  1 each am x 2 days and stop

## 2022-04-30 NOTE — Progress Notes (Signed)
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 PF  Chief Complaint  Patient presents with   Consult    SOB with mild activities like playing with dog.  Sx worse when pt wakes up in mornings.  Patient states when he is in full activity, he does not experience SOB.  Pt states sleeps with mouth open and has to breath through mouth.  Nasal congestion and drainage persistent.  Dyspnea:  acute onset sob /cough w/in 12 hour of exp and daily since but not reproduced with exercise  Can work his job ok standing/ herding crowds Wakes up with dry throat that was not the case prior to event assoc with nasal congestion and some bleeding  Cough: mucoid nl time in am  Sleep: pillow flat bed  SABA use: none but lots of nasal sprays which is a new problem for him  No obvious day to day or daytime pattern/variability or assoc  purulent sputum or mucus plugs or hemoptysis or cp or chest tightness, subjective wheeze or overt  hb symptoms.   sleeping without nocturnal   exacerbation  of respiratory  c/o's or need for noct saba. Also denies any obvious fluctuation of symptoms with weather or environmental changes or other aggravating or alleviating factors except as outlined above   No unusual exposure hx or h/o childhood pna/ asthma or knowledge of premature birth.  Current Allergies, Complete Past Medical History, Past Surgical History, Family History, and Social History were reviewed in [facility] record.  ROS  The following are not active complaints unless bolded Hoarseness, sore throat, dysphagia, dental problems, itching, sneezing,  nasal congestion or discharge of excess mucus or purulent secretions, ear ache,    fever, chills, sweats, unintended wt loss or wt gain, classically pleuritic or exertional cp,  orthopnea pnd or arm/hand swelling  or leg swelling, presyncope, palpitations, abdominal pain, anorexia, nausea, vomiting, diarrhea  or change in bowel habits or change in bladder habits, change in stools or change in urine, dysuria, hematuria,  rash, arthralgias, visual complaints, headache, numbness, weakness or ataxia or problems with walking or coordination,  change in mood or  memory.           Past Medical History:  Diagnosis Date   Heart murmur    Nervous stomach     Outpatient Medications Prior to Visit  Medication Sig Dispense Refill   aspirin 81 MG tablet Take 162 mg by mouth daily.      Multiple Vitamins-Minerals (MULTIVITAMIN WITH MINERALS) tablet Take 1 tablet by mouth daily.     Facility-Administered Medications Prior to Visit  Medication Dose Route Frequency Provider Last Rate Last Admin   0.9 %  sodium chloride infusion  500 mL Intravenous Once Pyrtle, Lajuan Lines, MD         Objective:     BP 116/64 (BP Location: Left Arm, Patient Position: Sitting, Cuff Size: Normal)   Pulse 84   Temp 97.9 F (36.6 C) (Oral)   Ht '5\' 10"'$  (1.778 m)   Wt 156 lb 6.4 oz (70.9 kg)   SpO2 94%   BMI 22.44 kg/m   SpO2: 94 %  Amb hoarse  pleasant elderly bm nad     HEENT : Oropharynx  clear     Nasal turbinates moderate non-specific edema    NECK :  without  apparent JVD/ palpable Nodes/TM    LUNGS: no acc muscle use,  Nl contour chest insp crackles bases  bilaterally without cough on insp or exp maneuvers   CV:  RRR  no s3 or murmur or increase in P2, and no edema   ABD:  soft and nontender with nl inspiratory excursion in the supine position. No bruits or organomegaly appreciated   MS:  Nl gait/ ext warm without deformities Or obvious joint restrictions  calf tenderness, cyanosis  - ? Very mild clubbing    SKIN: warm and dry without lesions    NEURO:  alert, approp, nl sensorium  with  no motor or cerebellar deficits apparent.        Assessment   DOE (dyspnea on exertion) Onset Nov 2022 p cold air exp x 6 hours  - CT chest (not HRCT)  10/02/21 ? PF  - 04/30/2022   Walked on RA  x  2  lap(s) =  approx 500  ft  @ mod to fast  pace, stopped due to desats to 85% s sob     DDx for pulmonary fibrosis  includes idiopathic pulmonary fibrosis, pulmonary fibrosis associated with rheumatologic diseases (which have a relatively benign course in most cases) , adverse effect from  drugs such as chemotherapy or amiodarone exposure, nonspecific interstitial pneumonia which is typically steroid responsive, and chronic hypersensitivity pneumonitis.   In active  smokers Langerhan's Cell  Histiocyctosis (eosinophilic granuomatosis),  DIP,  and Respiratory Bronchiolitis ILD also need to be considered,  but would not apply here  Will need to complete the w/u with pfts and hrct but 1st rx for gerd  which improved survival time and with decreased radiologic fibrosis per King's study published in AJRCCM vol 184 p1390.  Dec 2011 and also may have other beneficial effects as per the latest review in [place] vol 193 E5631 Jun 20016.  This may not always be cause and effect, but given limited immediate options for rx  rec start  rx ppi / diet/ lifestyle modification and f/u with serial walking sats and get baseline lung volumes for now  and  put "more points on the curve"establish a baseline before considering additional measures/ refer to PF clinic.   Chronic cough Onset Nov 2023 p severe cold air exp/ assoc with epistaxis  - FENO  04/30/2022   =  35 on no RX  - Allergy screen 04/30/2022 >  Eos 0. /  IgE  pending  - Sinus CT pending   Most likely this is rhinitis/ pnds ? Related cough  and not related to PF as no cough demonstrated on deep insp  Rec Prednisone 10 mg take  4 each am x 2 days,   2 each am x 2 days,  1 each am x 2 days and stop plus rx\ I emphasized that nasal steroids have no  immediate benefit in terms of improving symptoms.  To help them reached the target tissue, the patient should use Afrin 1-2puffs every 12 hours applied one min before using the nasal steroids(nasacort otc) .  Afrin should be stopped after no more than 5 days.  If the symptoms worsen, Afrin can be restarted after 5 days off of therapy to prevent rebound congestion from overuse of Afrin.     Discussed in detail all the  indications, usual  risks and alternatives  relative to the benefits with patient who agrees to proceed with Rx as outlined.       Each maintenance medication was reviewed in detail including emphasizing most importantly the difference between maintenance and prns and under what circumstances the prns are to be triggered using an action plan format where appropriate.  Total time for H and P, chart review, counseling,  directly observing portions of ambulatory 02 saturation study/ and generating customized AVS unique to this office visit / same day charting = > 60 min        Christinia Gully, MD 04/30/2022

## 2022-04-30 NOTE — Assessment & Plan Note (Signed)
Onset Nov 2023 p severe cold air exp/ assoc with epistaxis  - FENO  04/30/2022   =  35 on no RX  - Allergy screen 04/30/2022 >  Eos 0. /  IgE  pending  - Sinus CT pending   Most likely this is rhinitis/ pnds ? Related cough  and not related to PF as no cough demonstrated on deep insp  Rec Prednisone 10 mg take  4 each am x 2 days,   2 each am x 2 days,  1 each am x 2 days and stop plus rx\ I emphasized that nasal steroids have no immediate benefit in terms of improving symptoms.  To help them reached the target tissue, the patient should use Afrin 1-2puffs every 12 hours applied one min before using the nasal steroids(nasacort otc) .  Afrin should be stopped after no more than 5 days.  If the symptoms worsen, Afrin can be restarted after 5 days off of therapy to prevent rebound congestion from overuse of Afrin.     Discussed in detail all the  indications, usual  risks and alternatives  relative to the benefits with patient who agrees to proceed with Rx as outlined.

## 2022-04-30 NOTE — Assessment & Plan Note (Signed)
Onset Nov 2022 p cold air exp x 6 hours  - CT chest (not HRCT)  10/02/21 ? PF  - 04/30/2022   Walked on RA  x  2  lap(s) =  approx 500  ft  @ mod to fast  pace, stopped due to desats to 85% s sob     DDx for pulmonary fibrosis  includes idiopathic pulmonary fibrosis, pulmonary fibrosis associated with rheumatologic diseases (which have a relatively benign course in most cases) , adverse effect from  drugs such as chemotherapy or amiodarone exposure, nonspecific interstitial pneumonia which is typically steroid responsive, and chronic hypersensitivity pneumonitis.   In active  smokers Langerhan's Cell  Histiocyctosis (eosinophilic granuomatosis),  DIP,  and Respiratory Bronchiolitis ILD also need to be considered,  but would not apply here  Will need to complete the w/u with pfts and hrct but 1st rx for gerd  which improved survival time and with decreased radiologic fibrosis per King's study published in AJRCCM vol 184 p1390.  Dec 2011 and also may have other beneficial effects as per the latest review in [place] vol 193 [id number] Jun 20016.  This may not always be cause and effect, but given limited immediate options for rx  rec start  rx ppi / diet/ lifestyle modification and f/u with serial walking sats and get baseline lung volumes for now  and  put "more points on the curve"establish a baseline before considering additional measures/ refer to PF clinic.  Each maintenance medication was reviewed in detail including emphasizing most importantly the difference between maintenance and prns and under what circumstances the prns are to be triggered using an action plan format where appropriate.  Total time for H and P, chart review, counseling,  directly observing portions of ambulatory 02 saturation study/ and generating customized AVS unique to this office visit / same day charting = > 60 min

## 2022-05-01 LAB — IGE: IgE (Immunoglobulin E), Serum: 108 kU/L (ref <=114)

## 2022-05-08 ENCOUNTER — Encounter: Payer: Self-pay | Admitting: Cardiovascular Disease

## 2022-05-08 ENCOUNTER — Ambulatory Visit: Payer: Medicare PPO | Attending: Cardiovascular Disease | Admitting: Cardiovascular Disease

## 2022-05-08 VITALS — BP 110/58 | HR 91 | Ht 70.0 in | Wt 158.6 lb

## 2022-05-08 DIAGNOSIS — I350 Nonrheumatic aortic (valve) stenosis: Secondary | ICD-10-CM

## 2022-05-08 NOTE — Progress Notes (Unsigned)
Cardiology Office Note:    Date:  05/09/2022   ID:  Eric Noble., DOB 04-09-1940, MRN [id number]  PCP:  Wendie Agreste, MD   [place] Providers Cardiologist:  Levaeh Vice     Referring MD: Wendie Agreste, MD   Chief Complaint  Patient presents with   Fatigue   aortic calcification    coronary artery calcification    Heart Murmur    History of Present Illness:    Eric Ferrell.  ("NA-zar) is a 82 y.o. male with a hx of fibrotic interstitial lung disease.  He was incidentally noted to have coronary artery calcifications as well as calcifications on his aorta.  We are asked to see him for further evaluation.  Has had a murmur   Has had dyspnea for years .  No CP   Walks regularly    Has seen [name] .  Noted fibrotic lung changes Also thought he had allergies   Is a pastor  Works part time at [employer]    Past Medical History:  Diagnosis Date   Heart murmur    Nervous stomach     Past Surgical History:  Procedure Laterality Date   COLONOSCOPY     CYSTECTOMY     left leg   POLYPECTOMY      Current Medications: Current Meds  Medication Sig   aspirin 81 MG tablet Take 162 mg by mouth daily.    famotidine (PEPCID) 20 MG tablet One after supper   Multiple Vitamins-Minerals (MULTIVITAMIN WITH MINERALS) tablet Take 1 tablet by mouth daily.   pantoprazole (PROTONIX) 40 MG tablet Take 1 tablet (40 mg total) by mouth daily. Take 30-60 min before first meal of the day   predniSONE (DELTASONE) 10 MG tablet Take  4 each am x 2 days,   2 each am x 2 days,  1 each am x 2 days and stop     Allergies:   Patient has no known allergies.   Social History   Socioeconomic History   Marital status: Married    Spouse name: Not on file   Number of children: Not on file   Years of education: Not on file   Highest education level: Not on file  Occupational History   Occupation: Herbalist  Tobacco Use   Smoking status: Former     Packs/day: 0.50    Years: 5.00    Total pack years: 2.50    Types: Cigarettes    Quit date: 1972    Years since quitting: 52.1   Smokeless tobacco: Never   Tobacco comments:    in college  Vaping Use   Vaping Use: Never used  Substance and Sexual Activity   Alcohol use: No    Alcohol/week: 0.0 standard drinks of alcohol   Drug use: No   Sexual activity: Yes  Other Topics Concern   Not on file  Social History Narrative   Married.   Education: [employer]   Exercise: Yes   Social Determinants of Health   Financial Resource Strain: Low Risk  (02/27/2022)   Overall Financial Resource Strain (CARDIA)    Difficulty of Paying Living Expenses: Not hard at all  Food Insecurity: No Food Insecurity (02/27/2022)   Hunger Vital Sign    Worried About Running Out of Food in the Last Year: Never true    Ran Out of Food in the Last Year: Never true  Transportation Needs: No Transportation Needs (02/27/2022)   PRAPARE - Transportation  Lack of Transportation (Medical): No    Lack of Transportation (Non-Medical): No  Physical Activity: Sufficiently Active (02/27/2022)   Exercise Vital Sign    Days of Exercise per Week: 4 days    Minutes of Exercise per Session: 40 min  Stress: No Stress Concern Present (02/27/2022)   [place]    Feeling of Stress : Not at all  Social Connections: [place] (02/27/2022)   Social Connection and Isolation Panel [NHANES]    Frequency of Communication with Friends and Family: More than three times a week    Frequency of Social Gatherings with Friends and Family: Once a week    Attends Religious Services: More than 4 times per year    Active Member of [employer] or Organizations: Yes    Attends Music therapist: More than 4 times per year    Marital Status: Married     Family History: The patient's family history includes Diabetes in his mother and sister; Healthy in his  brother, brother, father, maternal grandfather, maternal grandmother, paternal grandfather, paternal grandmother, and sister; Stroke in his mother. There is no history of Colon cancer, Esophageal cancer, Prostate cancer, Rectal cancer, or Stomach cancer.  ROS:   Please see the history of present illness.     All other systems reviewed and are negative.  EKGs/Labs/Other Studies Reviewed:    The following studies were reviewed today:   EKG:    10/04/21 :  NSR , no ST or T wave changes   Recent Labs: 04/10/2022: ALT 8; BUN 21; Creatinine, Ser 0.78; Potassium 4.9; Sodium 135; TSH 1.06 04/30/2022: Hemoglobin 13.8; Platelets 450.0  Recent Lipid Panel    Component Value Date/Time   CHOL 167 04/10/2022 1438   TRIG 110.0 04/10/2022 1438   HDL 42.90 04/10/2022 1438   CHOLHDL 4 04/10/2022 1438   VLDL 22.0 04/10/2022 1438   LDLCALC 102 (H) 04/10/2022 1438     Risk Assessment/Calculations:                Physical Exam:    VS:  BP (!) 110/58   Pulse 91   Ht '5\' 10"'$  (1.778 m)   Wt 158 lb 9.6 oz (71.9 kg)   SpO2 98%   BMI 22.76 kg/m     Wt Readings from Last 3 Encounters:  05/08/22 158 lb 9.6 oz (71.9 kg)  04/30/22 156 lb 6.4 oz (70.9 kg)  04/11/22 162 lb 2 oz (73.5 kg)     GEN:  Well nourished, well developed in no acute distress HEENT: Normal NECK: No JVD; No carotid bruits LYMPHATICS: No lymphadenopathy CARDIAC: RR , 1-0/2 holosystolic murmur , at LSB , radiates up to [facility] and into carotids  RESPIRATORY:  fines rales in bases  ABDOMEN: Soft, non-tender, non-distended MUSCULOSKELETAL:  No edema; No deformity  SKIN: Warm and dry NEUROLOGIC:  Alert and oriented x 3 PSYCHIATRIC:  Normal affect   ASSESSMENT:    1. Aortic valve stenosis, etiology of cardiac valve disease unspecified    PLAN:    In order of problems listed above:  Aortic stenosis :   has AS on exam .   Slightly diminished pulse pressure .  Will get an echo for further evaluation   2.  Chronic rales : ?    idiopathic lung disease ? Pulmonary fibrosis   Has chronic DOE            Medication Adjustments/Labs and Tests Ordered: Current medicines are  reviewed at length with the patient today.  Concerns regarding medicines are outlined above.  Orders Placed This Encounter  Procedures   ECHOCARDIOGRAM COMPLETE   No orders of the defined types were placed in this encounter.   Patient Instructions  Medication Instructions:  Your physician recommends that you continue on your current medications as directed. Please refer to the Current Medication list given to you today.  *If you need a refill on your cardiac medications before your next appointment, please call your pharmacy*  Lab Work: None  Testing/Procedures: Your physician has requested that you have an echocardiogram. Echocardiography is a painless test that uses sound waves to create images of your heart. It provides your doctor with information about the size and shape of your heart and how well your heart's chambers and valves are working. This procedure takes approximately one hour. There are no restrictions for this procedure. Please do NOT wear cologne, perfume, aftershave, or lotions (deodorant is allowed). Please arrive 15 minutes prior to your appointment time.  Follow-Up: At [facility], you and your health needs are our priority.  As part of our continuing mission to provide you with exceptional heart care, we have created designated Provider Care Teams.  These Care Teams include your primary Cardiologist (physician) and Advanced Practice Providers (APPs -  Physician Assistants and Nurse Practitioners) who all work together to provide you with the care you need, when you need it.  Your next appointment:   6 month(s)  Provider:   Mertie Moores, MD   Signed, Mertie Moores, MD  05/09/2022 9:10 PM    [place]

## 2022-05-08 NOTE — Patient Instructions (Signed)
Medication Instructions:  Your physician recommends that you continue on your current medications as directed. Please refer to the Current Medication list given to you today.  *If you need a refill on your cardiac medications before your next appointment, please call your pharmacy*  Lab Work: None  Testing/Procedures: Your physician has requested that you have an echocardiogram. Echocardiography is a painless test that uses sound waves to create images of your heart. It provides your doctor with information about the size and shape of your heart and how well your heart's chambers and valves are working. This procedure takes approximately one hour. There are no restrictions for this procedure. Please do NOT wear cologne, perfume, aftershave, or lotions (deodorant is allowed). Please arrive 15 minutes prior to your appointment time.  Follow-Up: At [facility], you and your health needs are our priority.  As part of our continuing mission to provide you with exceptional heart care, we have created designated Provider Care Teams.  These Care Teams include your primary Cardiologist (physician) and Advanced Practice Providers (APPs -  Physician Assistants and Nurse Practitioners) who all work together to provide you with the care you need, when you need it.  Your next appointment:   6 month(s)  Provider:   Mertie Moores, MD

## 2022-05-09 DIAGNOSIS — H353132 Nonexudative age-related macular degeneration, bilateral, intermediate dry stage: Secondary | ICD-10-CM | POA: Diagnosis not present

## 2022-05-13 ENCOUNTER — Encounter: Payer: Self-pay | Admitting: Family Medicine

## 2022-05-13 ENCOUNTER — Ambulatory Visit: Payer: Medicare PPO | Admitting: Family Medicine

## 2022-05-13 VITALS — BP 110/66 | HR 78 | Temp 97.6°F | Ht 70.0 in | Wt 157.2 lb

## 2022-05-13 DIAGNOSIS — I350 Nonrheumatic aortic (valve) stenosis: Secondary | ICD-10-CM | POA: Diagnosis not present

## 2022-05-13 DIAGNOSIS — R0982 Postnasal drip: Secondary | ICD-10-CM | POA: Diagnosis not present

## 2022-05-13 DIAGNOSIS — J8410 Pulmonary fibrosis, unspecified: Secondary | ICD-10-CM | POA: Diagnosis not present

## 2022-05-13 NOTE — Progress Notes (Addendum)
Subjective:  Patient ID: Eric Noble., male    DOB: Aug 10, 1940  Age: 82 y.o. MRN: [id number]  CC:  Chief Complaint  Patient presents with   Fatigue    Pt reports this comes and goes but doesn't seem as bad as it was apx 30 days ago pulmonary prescribed some new meds pt is unsure what these are and they are not showing in extended meds     HPI Eric Ferrell. presents for   Fatigue, dyspnea Follow-up from his physical January 3.  See details of that visit, but in summary he had had some fatigue off and on for the previous year.  Some shortness of breath, particular in the morning.  Chronic cough.  CT chest on 10/02/2021 with findings compatible with fibrotic interstitial lung disease but also had aortic valve calcifications and moderate three-vessel coronary artery calcifications with aortic atherosclerosis.  Referred to pulmonary and cardiology.  CMP reassuring with glucose 100 otherwise normal, CBC reassuring other than platelets of 451.  Normal hemoglobin.  TSH normal.  Appointment with pulmonary, [name] on 04/30/2022.  Possible postnasal drip syndrome, rhinitis component.  Treated with prednisone, Afrin, the nasal steroids.  Also diagnosed with pulmonary fibrosis with further testing planned.Sed rate was elevated at 71, repeat platelets stable at 450, IgE normal, repeat CT ordered -chest and maxillofacial CT planned on March 4, FeNO level 35. Feels well - improving after prednisone, 5 days of afrin, using nasacort daily. No heartburn. Fatigue has improved. Walking, staying active, not dyspneic.    Appointment with cardiology, Dr. Acie Fredrickson, on January 31.  Aortic stenosis based on exam, slightly diminished pulse pressure, echo planned for further evaluation.  Scheduled March 5.    History Patient Active Problem List   Diagnosis Date Noted   DOE (dyspnea on exertion) 04/30/2022   Chronic cough 04/30/2022   Past Medical History:  Diagnosis Date   Heart murmur    Nervous stomach     Past Surgical History:  Procedure Laterality Date   COLONOSCOPY     CYSTECTOMY     left leg   POLYPECTOMY     No Known Allergies Prior to Admission medications   Medication Sig Start Date End Date Taking? Authorizing Provider  aspirin 81 MG tablet Take 162 mg by mouth daily.    Yes [provider]  famotidine (PEPCID) 20 MG tablet One after supper 04/30/22  Yes Tanda Rockers, MD  Multiple Vitamins-Minerals (MULTIVITAMIN WITH MINERALS) tablet Take 1 tablet by mouth daily.   Yes [provider]  pantoprazole (PROTONIX) 40 MG tablet Take 1 tablet (40 mg total) by mouth daily. Take 30-60 min before first meal of the day 04/30/22  Yes Tanda Rockers, MD  predniSONE (DELTASONE) 10 MG tablet Take  4 each am x 2 days,   2 each am x 2 days,  1 each am x 2 days and stop 04/30/22  Yes Tanda Rockers, MD   Social History   Socioeconomic History   Marital status: Married    Spouse name: Not on file   Number of children: Not on file   Years of education: Not on file   Highest education level: Not on file  Occupational History   Occupation: Herbalist  Tobacco Use   Smoking status: Former    Packs/day: 0.50    Years: 5.00    Total pack years: 2.50    Types: Cigarettes    Quit date: 1972    Years since  quitting: 52.1   Smokeless tobacco: Never   Tobacco comments:    in college  Vaping Use   Vaping Use: Never used  Substance and Sexual Activity   Alcohol use: No    Alcohol/week: 0.0 standard drinks of alcohol   Drug use: No   Sexual activity: Yes  Other Topics Concern   Not on file  Social History Narrative   Married.   Education: [employer]   Exercise: Yes   Social Determinants of Health   Financial Resource Strain: Low Risk  (02/27/2022)   Overall Financial Resource Strain (CARDIA)    Difficulty of Paying Living Expenses: Not hard at all  Food Insecurity: No Food Insecurity (02/27/2022)   Hunger Vital Sign    Worried About Running Out of Food  in the Last Year: Never true    Ran Out of Food in the Last Year: Never true  Transportation Needs: No Transportation Needs (02/27/2022)   PRAPARE - Hydrologist (Medical): No    Lack of Transportation (Non-Medical): No  Physical Activity: Sufficiently Active (02/27/2022)   Exercise Vital Sign    Days of Exercise per Week: 4 days    Minutes of Exercise per Session: 40 min  Stress: No Stress Concern Present (02/27/2022)   [place]    Feeling of Stress : Not at all  Social Connections: [place] (02/27/2022)   Social Connection and Isolation Panel [NHANES]    Frequency of Communication with Friends and Family: More than three times a week    Frequency of Social Gatherings with Friends and Family: Once a week    Attends Religious Services: More than 4 times per year    Active Member of [employer] or Organizations: Yes    Attends Archivist Meetings: More than 4 times per year    Marital Status: Married  Human resources officer Violence: Not At Risk (02/27/2022)   Humiliation, Afraid, Rape, and Kick questionnaire    Fear of Current or Ex-Partner: No    Emotionally Abused: No    Physically Abused: No    Sexually Abused: No    Review of Systems Per HPI.   Objective:   Vitals:   05/13/22 0948  BP: 110/66  Pulse: 78  Temp: 97.6 F (36.4 C)  TempSrc: Oral  SpO2: 93%  Weight: 157 lb 3.2 oz (71.3 kg)  Height: '5\' 10"'$  (1.778 m)     Physical Exam Vitals reviewed.  Constitutional:      Appearance: He is well-developed.  HENT:     Head: Normocephalic and atraumatic.  Neck:     Vascular: No carotid bruit or JVD.  Cardiovascular:     Rate and Rhythm: Normal rate and regular rhythm.     Heart sounds: Murmur (9-9/8 systolic ejection.) heard.  Pulmonary:     Effort: Pulmonary effort is normal.     Breath sounds: Rales (coarse sounds inferiorly.) present.  Musculoskeletal:      Right lower leg: No edema.     Left lower leg: No edema.  Skin:    General: Skin is warm and dry.  Neurological:     Mental Status: He is alert and oriented to person, place, and time.  Psychiatric:        Mood and Affect: Mood normal.     Assessment & Plan:  Eric Ferrell. is a 82 y.o. male . Pulmonary fibrosis ([place])  Aortic valve stenosis, etiology of cardiac valve disease  unspecified  PND (post-nasal drip)  Review of outside records and plan as above from pulmonary and cardiology.  With his improved symptoms I suspect a significant portion of the respiratory symptoms were due to postnasal drip/sinus congestion.  Continue steroid nasal spray, discussed appropriate technique to lessen drainage down throat.  Continue follow-up as planned for high-resolution CT and CT sinuses as well as follow-up for echo to evaluate aortic stenosis and severity.  No new treatments or testing at this time with symptomatic improvement but RTC precautions given.  49-monthfollow-up  No orders of the defined types were placed in this encounter.  Patient Instructions  Thanks for seeing the specialists. Glad to hear that the nasal sprays have helped, but the other tests should provide some more info. Echo from cardiology should also provide some info on the heart valve.  Follow-up 5 months but let me know if there are any questions sooner.  Take care!       Signed,   JMerri Ray MD [facility] 05/13/22 10:39 AM

## 2022-05-13 NOTE — Patient Instructions (Addendum)
Thanks for seeing the specialists. Glad to hear that the nasal sprays have helped, but the other tests should provide some more info. Echo from cardiology should also provide some info on the heart valve.  Follow-up 5 months but let me know if there are any questions sooner.  Take care!

## 2022-06-10 ENCOUNTER — Ambulatory Visit (HOSPITAL_COMMUNITY): Admission: RE | Admit: 2022-06-10 | Payer: Medicare PPO | Source: Ambulatory Visit

## 2022-06-10 ENCOUNTER — Telehealth: Payer: Self-pay | Admitting: Internal Medicine

## 2022-06-10 ENCOUNTER — Ambulatory Visit: Payer: Medicare PPO | Admitting: Internal Medicine

## 2022-06-10 ENCOUNTER — Ambulatory Visit (HOSPITAL_COMMUNITY): Payer: Medicare PPO

## 2022-06-10 ENCOUNTER — Encounter (HOSPITAL_COMMUNITY): Payer: Self-pay

## 2022-06-10 NOTE — Telephone Encounter (Signed)
Gerald Stabs needs c;lairfication of CT face order. Gerald Stabs phone number is [phone].

## 2022-06-11 ENCOUNTER — Other Ambulatory Visit (HOSPITAL_COMMUNITY): Payer: Medicare PPO

## 2022-06-11 NOTE — Telephone Encounter (Signed)
Called and spoke with [facility]. They stated that patient was a no show for his CT scans and she wasn't sure what chris needed clarification on but that she was going to let him know that I had called.   Nothing further needed at this time.

## 2022-06-12 ENCOUNTER — Ambulatory Visit: Payer: Medicare PPO | Admitting: Internal Medicine

## 2022-06-12 NOTE — Progress Notes (Deleted)
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 PF  Chief Complaint  Patient presents with   Consult    SOB with mild activities like playing with dog.  Sx worse when pt wakes up in mornings.  Patient states when he is in full activity, he does not experience SOB.  Pt states sleeps with mouth open and has to breath through mouth.  Nasal congestion and drainage persistent.  Dyspnea:  acute onset sob /cough w/in 12 hour of exp and daily since but not reproduced with exercise  Can work his job ok standing/ herding crowds Wakes up with dry throat that was not the case prior to event assoc with nasal congestion and some bleeding  Cough: mucoid nl time in am  Sleep: pillow flat bed  SABA use: none but lots of nasal sprays which is a new problem for him Rec Pantoprazole (protonix) 40 mg   Take  30-60 min before first meal of the day and Pepcid (famotidine)  20 mg after supper until return to office For nasal congestion suggest afrin (5 days only)  before nasort [place] twice daily - both over the counter - if wait 5 days can do afrrin another 5 days or alternate sides 5 days each to prevent rebound congestion GERD diet reviewed, bed blocks rec  My office will be contacting you by phone for referral to CT chest/sinus  for 1st week in March  > no show Make sure you check your oxygen saturation at your highest level of activity(NOT after you stop)  to be sure it stays over 90%  Add: due to elevated  FENO rec  try Prednisone 10 mg take  4 each am x 2 days,   2 each am x 2 days,  1 each am x 2 days and stop   Please schedule a follow up office visit in 6 weeks, call sooner if needed with pfts on return   - FENO  04/30/2022   =  35 on no RX   - Allergy screen 04/30/2022 >  Eos 0.2 /  IgE  108   06/12/2022  f/u ov/Elzie Sheets re: ***   maint on ***  No chief complaint on file.   Dyspnea:  *** Cough: *** Sleeping: *** SABA use: *** 02: *** Covid status:   *** Lung cancer screening :  ***    No obvious day to day or daytime variability or assoc excess/ purulent sputum or mucus plugs or hemoptysis or cp or chest tightness, subjective wheeze or overt sinus or hb symptoms.   *** without nocturnal  or early am exacerbation  of respiratory  c/o's or need for noct saba. Also denies any obvious fluctuation of symptoms with weather or environmental changes or other aggravating or alleviating factors except as outlined above   No unusual exposure hx or h/o childhood pna/ asthma or knowledge of premature birth.  Current Allergies, Complete Past Medical History, Past Surgical History, Family History, and Social History were reviewed in [employer] record.  ROS  The following are not active complaints unless bolded Hoarseness, sore throat, dysphagia, dental problems, itching, sneezing,  nasal congestion or discharge of  excess mucus or purulent secretions, ear ache,   fever, chills, sweats, unintended wt loss or wt gain, classically pleuritic or exertional cp,  orthopnea pnd or arm/hand swelling  or leg swelling, presyncope, palpitations, abdominal pain, anorexia, nausea, vomiting, diarrhea  or change in bowel habits or change in bladder habits, change in stools or change in urine, dysuria, hematuria,  rash, arthralgias, visual complaints, headache, numbness, weakness or ataxia or problems with walking or coordination,  change in mood or  memory.        No outpatient medications have been marked as taking for the 06/12/22 encounter (Appointment) with Tanda Rockers, MD.                   Past Medical History:  Diagnosis Date   Heart murmur    Nervous stomach       Objective:     Wt Readings from Last 3  Encounters:  05/13/22 157 lb 3.2 oz (71.3 kg)  05/08/22 158 lb 9.6 oz (71.9 kg)  04/30/22 156 lb 6.4 oz (70.9 kg)      Vital signs reviewed  06/12/2022  - Note at rest 02 sats  ***% on ***   General appearance:    ***       insp crackles bases  bilaterally  ? Very mild clubbing ***          Assessment

## 2022-06-13 ENCOUNTER — Ambulatory Visit (HOSPITAL_COMMUNITY): Payer: Medicare PPO | Attending: Cardiovascular Disease

## 2022-06-13 DIAGNOSIS — I350 Nonrheumatic aortic (valve) stenosis: Secondary | ICD-10-CM | POA: Insufficient documentation

## 2022-06-13 LAB — ECHOCARDIOGRAM COMPLETE
AR max vel: 0.68 cm2
AV Area VTI: 0.71 cm2
AV Area mean vel: 0.62 cm2
AV Mean grad: 27 mmHg
AV Peak grad: 46 mmHg
Ao pk vel: 3.39 m/s
Area-P 1/2: 2.01 cm2
P 1/2 time: 616 msec
S' Lateral: 2.6 cm

## 2022-06-13 MED ORDER — PERFLUTREN LIPID MICROSPHERE
3.0000 mL | INTRAVENOUS | Status: AC | PRN
  Administered 2022-06-13: 3 mL via INTRAVENOUS

## 2022-06-26 ENCOUNTER — Telehealth: Payer: Self-pay

## 2022-06-26 NOTE — Telephone Encounter (Signed)
Patient has OV tomorrow at 9:45 with [name].  AVS states order for CT Chest/Sinus.  Order in [facility] but no results found. Called patient to get info on if patient did CT scans and what location to get results for review at [place] with [name]. Left message on VM for patient to call office with info.

## 2022-06-27 ENCOUNTER — Encounter: Payer: Self-pay | Admitting: Internal Medicine

## 2022-06-27 ENCOUNTER — Ambulatory Visit: Payer: Medicare PPO | Admitting: Internal Medicine

## 2022-06-27 VITALS — BP 116/66 | HR 76 | Temp 97.9°F | Ht 70.0 in | Wt 153.8 lb

## 2022-06-27 DIAGNOSIS — R053 Chronic cough: Secondary | ICD-10-CM | POA: Diagnosis not present

## 2022-06-27 DIAGNOSIS — R0609 Other forms of dyspnea: Secondary | ICD-10-CM | POA: Diagnosis not present

## 2022-06-27 NOTE — Assessment & Plan Note (Signed)
Onset Nov 2022 p cold air exp x 6 hours  - CT chest (not HRCT)  10/02/21 ? PF present on cxr 04/22/2019 - 04/30/2022   Walked on RA  x  2  lap(s) =  approx 500  ft  @ mod to fast  pace, stopped due to desats to 85% s sob    -  04/30/2022     ESR  = 71 > rx pred x 6 days only > returned 06/27/2022 "all better" = able to walk dog fine including some hills  - Echo 06/13/22  Mod/severe AS with G1 diastolic dysfunction and nl LA -  06/27/2022   Walked on RA  x  3  lap(s) =  approx 750  ft  @ mod pace, stopped due to end of study with lowest 02 sats 93% and no significant sob  Combination of PF and AS problematic but likely longstanding and compensated x in time of resp decompensation > hypoxemia and higher CO demand.  Advised to pace himself and track 02 sats with ex over time to "collect the dots to connect the dots"  >>>  proceed with HRCT chest and PF clinic referral.and f/u in this clinic is prn

## 2022-06-27 NOTE — Patient Instructions (Addendum)
My office will be contacting you by phone for referral to sinus and chest CT   - if you don't hear back from my office within one week please call us back or notify us thru MyChart and we'll address it right away.   We will also set you up for follow up in the Pulmonary fibrosis clinic   Make sure you check your oxygen saturation at your highest level of activity(NOT after you stop)  to be sure it stays over 90% and keep track of it at least once a week, more often if breathing getting worse, and let me know if losing ground. (Collect the dots to connect the dots approach)    Pace yourself because your Aortic stenosis limits blood flow to your body from your heart and your blood does not carry as much oxygen as it should because of your pulmonary fibrosis - goal is to keep your 0xygen level above 90%    F/u in this clinic not needed.

## 2022-06-27 NOTE — Telephone Encounter (Signed)
Patient came in today for OV with [name].  Discussed issue with CT - chest/sinus with Dr Melvyn Novas.  Nothing further needed regarding this encounter.

## 2022-06-27 NOTE — Assessment & Plan Note (Signed)
Onset Nov 2023 p severe cold air exp/ assoc with epistaxis  - FENO  04/30/2022   =  35 on no RX  - Allergy screen 04/30/2022 >  Eos 0.2 /  IgE  108 - Sinus CT ordered 06/27/2022   Appears to have responded to short course of prednisone and doing fine now s specific rx x for maint rx for gerd now based on concerns of cyclical coughing developing in pt who also has underlying PF and Use of PPI is associated with improved survival time and with decreased radiologic fibrosis per King's study published in AJRCCM vol 184 p1390.  Dec 2011 and also may have other beneficial effects as per the latest review in [place] vol 193 [id number] Jun 20016.  Each maintenance medication was reviewed in detail including emphasizing most importantly the difference between maintenance and prns and under what circumstances the prns are to be triggered using an action plan format where appropriate.  Total time for H and P, chart review, counseling,  directly observing portions of ambulatory 02 saturation study/ and generating customized AVS unique to this office visit / same day charting  > 30 min

## 2022-06-27 NOTE — Progress Notes (Signed)
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 PF  Chief Complaint  Patient presents with   Consult    SOB with mild activities like playing with dog.  Sx worse when pt wakes up in mornings.  Patient states when he is in full activity, he does not experience SOB.  Pt states sleeps with mouth open and has to breath through mouth.  Nasal congestion and drainage persistent.  Dyspnea:  acute onset sob /cough w/in 12 hour of exp and daily since but not reproduced with exercise  Can work his job ok standing/ herding crowds Wakes up with dry throat that was not the case prior to event assoc with nasal congestion and some bleeding  Cough: mucoid nl time in am  Sleep: pillow flat bed  SABA use: none but lots of nasal sprays which is a new problem for him Rec Pantoprazole (protonix) 40 mg   Take  30-60 min before first meal of the day and Pepcid (famotidine)  20 mg after supper until return to office For nasal congestion suggest afrin (5 days only)  before nasort [place] twice daily - both over the counter - if wait 5 days can do afrrin another 5 days or alternate sides 5 days each to prevent rebound congestion GERD diet reviewed, bed blocks rec  My office will be contacting you by phone for referral to CT chest/sinus  for 1st week in March  > no show Make sure you check your oxygen saturation at your highest level of activity(NOT after you stop)  to be sure it stays over 90%  Add: due to elevated  FENO rec  try Prednisone 10 mg take  4 each am x 2 days,   2 each am x 2 days,  1 each am x 2 days and stop   Please schedule a follow up office visit in 6 weeks, call sooner if needed with pfts on return   - FENO  04/30/2022   =  35 on no  RX  - Allergy screen 04/30/2022 >  Eos 0.2 /  IgE  108  Echo 06/13/22  AS  mod to severe    06/27/2022  f/u ov/Eric Ferrell re: doe with PF/ AS maint on no resp rx other than nasacort and ppi ac and h2 q pm  Chief Complaint  Patient presents with   Follow-up    Sx improved.  SOB with mild exercises persistent.  Patient had echocardiogram but is unsure if he had CT chest/sinus.  Dyspnea:  walking dog fine, up hills  Cough: none Sleeping: flat bed one pillow  SABA use: no 02: none    No obvious day to day or daytime variability or assoc excess/ purulent sputum or mucus plugs or hemoptysis or cp or chest tightness, subjective wheeze or overt sinus or hb symptoms.   Sleeping as above without nocturnal  or early am exacerbation  of respiratory  c/o's or need for noct saba. Also denies any obvious fluctuation of symptoms with weather or environmental changes or other aggravating or alleviating factors except as outlined above   No unusual exposure hx or h/o childhood pna/ asthma or knowledge of  premature birth.  Current Allergies, Complete Past Medical History, Past Surgical History, Family History, and Social History were reviewed in [employer] record.  ROS  The following are not active complaints unless bolded Hoarseness, sore throat, dysphagia, dental problems, itching, sneezing,  nasal congestion or discharge of excess mucus or purulent secretions, ear ache,   fever, chills, sweats, unintended wt loss or wt gain, classically pleuritic or exertional cp,  orthopnea pnd or arm/hand swelling  or leg swelling, presyncope, palpitations, abdominal pain, anorexia, nausea, vomiting, diarrhea  or change in bowel habits or change in bladder habits, change in stools or change in urine, dysuria, hematuria,  rash, arthralgias, visual complaints, headache, numbness, weakness or ataxia or problems with walking or coordination,  change in mood or  memory.        Current Meds  Medication Sig    aspirin 81 MG tablet Take 162 mg by mouth daily.    famotidine (PEPCID) 20 MG tablet One after supper   Multiple Vitamins-Minerals (MULTIVITAMIN WITH MINERALS) tablet Take 1 tablet by mouth daily.   pantoprazole (PROTONIX) 40 MG tablet Take 1 tablet (40 mg total) by mouth daily. Take 30-60 min before first meal of the day                   Past Medical History:  Diagnosis Date   Heart murmur    Nervous stomach       Objective:     Wt Readings from Last 3 Encounters:  06/27/22 153 lb 12.8 oz (69.8 kg)  05/13/22 157 lb 3.2 oz (71.3 kg)  05/08/22 158 lb 9.6 oz (71.9 kg)      Vital signs reviewed  06/27/2022  - Note at rest 02 sats  92% on RA   General appearance:    amb pleasant bf surprisingly confused about details of care despite copious notes in his hands      HEENT : Oropharynx  clear        NECK :  without  apparent JVD/ palpable Nodes/TM    LUNGS: no acc muscle use,  Nl contour chest with insp crackles bilaterally without cough on insp or exp maneuvers   CV:  RRR  no s3 or murmur or increase in P2, and no edema   ABD:  soft and nontender with nl inspiratory excursion in the supine position. No bruits or organomegaly appreciated   MS:  Nl gait/ ext warm without deformities Or obvious joint restrictions  calf tenderness, cyanosis --  Minimal  Clubbing    SKIN: warm and dry without lesions    NEURO:  alert, approp, nl sensorium with  no motor or cerebellar deficits apparent.          Assessment

## 2022-07-17 ENCOUNTER — Ambulatory Visit (HOSPITAL_COMMUNITY)
Admission: RE | Admit: 2022-07-17 | Discharge: 2022-07-17 | Disposition: A | Discharge status: Home / Self Care | Payer: Medicare PPO | Source: Ambulatory Visit | Attending: Internal Medicine | Admitting: Internal Medicine

## 2022-07-17 DIAGNOSIS — R0609 Other forms of dyspnea: Secondary | ICD-10-CM

## 2022-07-17 DIAGNOSIS — J479 Bronchiectasis, uncomplicated: Secondary | ICD-10-CM | POA: Diagnosis not present

## 2022-07-17 DIAGNOSIS — J322 Chronic ethmoidal sinusitis: Secondary | ICD-10-CM | POA: Diagnosis not present

## 2022-07-26 ENCOUNTER — Other Ambulatory Visit: Payer: Self-pay | Admitting: Internal Medicine

## 2022-07-26 DIAGNOSIS — R0609 Other forms of dyspnea: Secondary | ICD-10-CM

## 2022-10-11 ENCOUNTER — Ambulatory Visit: Payer: Medicare PPO | Admitting: Family Medicine

## 2022-10-14 ENCOUNTER — Ambulatory Visit: Payer: Medicare PPO | Admitting: Family Medicine

## 2022-10-23 ENCOUNTER — Encounter: Payer: Self-pay | Admitting: Family Medicine

## 2022-10-23 ENCOUNTER — Ambulatory Visit: Payer: Medicare PPO | Admitting: Family Medicine

## 2022-10-23 VITALS — BP 108/66 | HR 82 | Temp 97.9°F | Ht 70.0 in | Wt 143.8 lb

## 2022-10-23 DIAGNOSIS — E7800 Pure hypercholesterolemia, unspecified: Secondary | ICD-10-CM | POA: Diagnosis not present

## 2022-10-23 DIAGNOSIS — J8410 Pulmonary fibrosis, unspecified: Secondary | ICD-10-CM

## 2022-10-23 DIAGNOSIS — I350 Nonrheumatic aortic (valve) stenosis: Secondary | ICD-10-CM | POA: Diagnosis not present

## 2022-10-23 DIAGNOSIS — R7303 Prediabetes: Secondary | ICD-10-CM

## 2022-10-23 NOTE — Progress Notes (Signed)
Subjective:  Patient ID: Eric Ferrell., male    DOB: 1940/09/05  Age: 82 y.o. MRN: [id number]  CC:  Chief Complaint  Patient presents with   Medical Management of Chronic Issues    Pt doing okay,     HPI Eric Ferrell. presents for   Follow-up  Pulmonary fibrosis: Appointment with pulmonary on March 21, Dr. Sherene Sires.  O2 sat 92% at that time.  Chronic cough, treated with PPI.  Prior prednisone short course.  Coexistent aortic stenosis.  Pulmonary fibrosis clinic referral placed and plan for high-resolution CT chest.  Did note that O2 sat should be above 90%, at highest level activity.  Also advised to pace activity due to aortic stenosis limiting blood flow.  High-resolution CT chest on 07/17/2022, consistent with pulmonary fibrosis, again plan for follow-up to PF clinic.  Still taking protonix in the morning, famotidine at night. Uses nasocort in morning.  Sometimes breathing does well, other times short of breath - flare this morning, better now. Did not check O2 sat - does not have oximeter to measure.  Declined pulmonary fibrosis clinic referral until he discussed with me.  Occasional cough, sometimes just trouble breathing.  No fever.  No chest pain.  Rare clear mucus production - clear. No recent changes.  Works at [employer] - some shortness of breath with walking up the stairs.   Prediabetes: Weight down 10 pounds. Borderline LDL in January. No current meds for blood sugar. No current statin.  Denies change in diet.  Lab Results  Component Value Date   HGBA1C 6.0 04/10/2022   Wt Readings from Last 3 Encounters:  10/23/22 143 lb 12.8 oz (65.2 kg)  06/27/22 153 lb 12.8 oz (69.8 kg)  05/13/22 157 lb 3.2 oz (71.3 kg)    History Patient Active Problem List   Diagnosis Date Noted   Pulmonary fibrosis (HCC) 05/13/2022   Aortic valve stenosis 05/13/2022   DOE (dyspnea on exertion) 04/30/2022   Chronic cough 04/30/2022   Past Medical History:  Diagnosis  Date   Heart murmur    Nervous stomach    Past Surgical History:  Procedure Laterality Date   COLONOSCOPY     CYSTECTOMY     left leg   POLYPECTOMY     No Known Allergies Prior to Admission medications   Medication Sig Start Date End Date Taking? Authorizing Provider  aspirin 81 MG tablet Take 162 mg by mouth daily.    Yes [provider]  famotidine (PEPCID) 20 MG tablet One after supper 04/30/22  Yes Nyoka Cowden, MD  Multiple Vitamins-Minerals (MULTIVITAMIN WITH MINERALS) tablet Take 1 tablet by mouth daily.   Yes [provider]  pantoprazole (PROTONIX) 40 MG tablet TAKE 1 TABLET(40 MG) BY MOUTH DAILY 30 TO 60 MINUTES BEFORE FIRST MEAL OF THE DAY 07/26/22  Yes Nyoka Cowden, MD   Social History   Socioeconomic History   Marital status: Married    Spouse name: Not on file   Number of children: Not on file   Years of education: Not on file   Highest education level: Not on file  Occupational History   Occupation: Nurse, children's  Tobacco Use   Smoking status: Former    Current packs/day: 0.00    Average packs/day: 0.5 packs/day for 5.0 years (2.5 ttl pk-yrs)    Types: Cigarettes    Start date: 60    Quit date: 103    Years since quitting: 52.5   Smokeless tobacco:  Never   Tobacco comments:    in college  Vaping Use   Vaping status: Never Used  Substance and Sexual Activity   Alcohol use: No    Alcohol/week: 0.0 standard drinks of alcohol   Drug use: No   Sexual activity: Yes  Other Topics Concern   Not on file  Social History Narrative   Married.   Education: [employer]   Exercise: Yes   Social Determinants of Health   Financial Resource Strain: Low Risk  (02/27/2022)   Overall Financial Resource Strain (CARDIA)    Difficulty of Paying Living Expenses: Not hard at all  Food Insecurity: No Food Insecurity (02/27/2022)   Hunger Vital Sign    Worried About Running Out of Food in the Last Year: Never true    Ran Out of Food in  the Last Year: Never true  Transportation Needs: No Transportation Needs (02/27/2022)   PRAPARE - Administrator, Civil Service (Medical): No    Lack of Transportation (Non-Medical): No  Physical Activity: Sufficiently Active (02/27/2022)   Exercise Vital Sign    Days of Exercise per Week: 4 days    Minutes of Exercise per Session: 40 min  Stress: No Stress Concern Present (02/27/2022)   [employer] of Occupational Health - Occupational Stress Questionnaire    Feeling of Stress : Not at all  Social Connections: Socially Integrated (02/27/2022)   Social Connection and Isolation Panel [NHANES]    Frequency of Communication with Friends and Family: More than three times a week    Frequency of Social Gatherings with Friends and Family: Once a week    Attends Religious Services: More than 4 times per year    Active Member of [employer] or Organizations: Yes    Attends Banker Meetings: More than 4 times per year    Marital Status: Married  Catering manager Violence: Not At Risk (02/27/2022)   Humiliation, Afraid, Rape, and Kick questionnaire    Fear of Current or Ex-Partner: No    Emotionally Abused: No    Physically Abused: No    Sexually Abused: No    Review of Systems  Per HPI.  Objective:   Vitals:   10/23/22 1342  BP: 108/66  Pulse: 82  Temp: 97.9 F (36.6 C)  TempSrc: Temporal  SpO2: (!) 89%  Weight: 143 lb 12.8 oz (65.2 kg)  Height: 5\' 10"  (1.778 m)     Physical Exam Vitals reviewed.  Constitutional:      Appearance: He is well-developed.  HENT:     Head: Normocephalic and atraumatic.  Neck:     Vascular: No carotid bruit or JVD.  Cardiovascular:     Rate and Rhythm: Normal rate and regular rhythm.     Heart sounds: Murmur (3-4/6 systolic) heard.  Pulmonary:     Effort: Pulmonary effort is normal.     Breath sounds: Normal breath sounds. No rales (coarse breath sounds lower lobes bilaterally.).  Musculoskeletal:     Right lower  leg: No edema.     Left lower leg: No edema.  Skin:    General: Skin is warm and dry.  Neurological:     Mental Status: He is alert and oriented to person, place, and time.  Psychiatric:        Mood and Affect: Mood normal.    O2 sat 89% at rest.  Ambulatory pulse ox with walking: nadir of 89% up to 91%   Assessment & Plan:  Eric Ferrell.  is a 82 y.o. male . Pulmonary fibrosis (HCC) - Plan: Ambulatory referral to Pulmonology Aortic valve stenosis, etiology of cardiac valve disease unspecified  -Intermittent episodes of dyspnea, including earlier today but improved in office.  Borderline O2 sat 89% in office, denies acute change in symptoms or acute dyspnea.  No change with ambulation.  Message sent to pulmonologist, will refer to pulmonary fibrosis clinic, may need to be seen soon to evaluate for oxygen treatment or change in therapy.  ER precautions given if any acute or worsening dyspnea  Prediabetes - Plan: Comprehensive metabolic panel, Hemoglobin A1c  -Check A1c, weight has decreased but likely related to lung disorder as above.  Close follow-up planned.   Elevated LDL cholesterol level - Plan: Lipid panel  -Mild elevation previously, check updated labs.  No orders of the defined types were placed in this encounter.  Patient Instructions  Thanks for coming in today.  I definitely do want you to see the pulmonary fibrosis clinic and I have placed the referral.  Oxygen level is 89% here today and your previous pulmonologist wanted that to be above 90%.  I will message Dr. Sherene Sires, and let she know if any changes in the meantime.  If you do have any worsening of shortness of breath, be seen right away.  I also recommend obtaining an oximeter to check your oxygen levels at home and make sure those stay above 90.  I will check labs for prediabetes and cholesterol today and let me know if there are any concerns.  No medication changes at this time.  Take care!  Return to the clinic or  go to the nearest emergency room if any of your symptoms worsen or new symptoms occur.     Signed,   Meredith Staggers, MD  Primary Care, [facility] 10/23/22 2:03 PM

## 2022-10-23 NOTE — Patient Instructions (Signed)
Thanks for coming in today.  I definitely do want you to see the pulmonary fibrosis clinic and I have placed the referral.  Oxygen level is 89% here today and your previous pulmonologist wanted that to be above 90%.  I will message Dr. Sherene Sires, and let she know if any changes in the meantime.  If you do have any worsening of shortness of breath, be seen right away.  I also recommend obtaining an oximeter to check your oxygen levels at home and make sure those stay above 90.  I will check labs for prediabetes and cholesterol today and let me know if there are any concerns.  No medication changes at this time.  Take care!  Return to the clinic or go to the nearest emergency room if any of your symptoms worsen or new symptoms occur.

## 2022-10-24 LAB — COMPREHENSIVE METABOLIC PANEL
ALT: 11 U/L (ref 0–53)
AST: 24 U/L (ref 0–37)
Albumin: 3.8 g/dL (ref 3.5–5.2)
Alkaline Phosphatase: 64 U/L (ref 39–117)
BUN: 17 mg/dL (ref 6–23)
CO2: 33 mEq/L — ABNORMAL HIGH (ref 19–32)
Calcium: 9.9 mg/dL (ref 8.4–10.5)
Chloride: 98 mEq/L (ref 96–112)
Creatinine, Ser: 0.81 mg/dL (ref 0.40–1.50)
GFR: 82.38 mL/min (ref >60.00)
Glucose, Bld: 129 mg/dL — ABNORMAL HIGH (ref 70–99)
Potassium: 4.9 mEq/L (ref 3.5–5.1)
Sodium: 134 mEq/L — ABNORMAL LOW (ref 135–145)
Total Bilirubin: 0.3 mg/dL (ref 0.2–1.2)
Total Protein: 8.9 g/dL — ABNORMAL HIGH (ref 6.0–8.3)

## 2022-10-24 LAB — LIPID PANEL
Cholesterol: 155 mg/dL (ref 0–200)
HDL: 44.4 mg/dL (ref >39.00)
LDL Cholesterol: 93 mg/dL (ref 0–99)
NonHDL: 110.66
Total CHOL/HDL Ratio: 3
Triglycerides: 87 mg/dL (ref 0.0–149.0)
VLDL: 17.4 mg/dL (ref 0.0–40.0)

## 2022-10-24 LAB — HEMOGLOBIN A1C: Hgb A1c MFr Bld: 6.2 % (ref 4.6–6.5)

## 2022-11-06 ENCOUNTER — Encounter (INDEPENDENT_AMBULATORY_CARE_PROVIDER_SITE_OTHER): Payer: Self-pay

## 2022-11-11 ENCOUNTER — Encounter: Payer: Self-pay | Admitting: Cardiovascular Disease

## 2022-11-11 NOTE — Progress Notes (Unsigned)
Cardiology Office Note:    Date:  11/12/2022   ID:  Eric Rowan., DOB 05/13/1940, MRN [id number]  PCP:  Shade Flood, MD   [facility] Providers Cardiologist:  Keliah Harned     Referring MD: Shade Flood, MD   Chief Complaint  Patient presents with   Aortic Stenosis    History of Present Illness:    Eric Rowan.  ("NA-zar) is a 82 y.o. male with a hx of fibrotic interstitial lung disease.  He was incidentally noted to have coronary artery calcifications as well as calcifications on his aorta.  We are asked to see him for further evaluation.  Has had a murmur   Has had dyspnea for years .  No CP   Walks regularly   K he is moving he is good have you see someone else okay with the okay so you Has seen Dr. Sherene Sires .  Noted fibrotic lung changes Also thought he had allergies   Is a pastor  Works part time at [employer] , and [employer]   Aug. 6, 2024 Seen with Askari ( son)   Eric Ferrell is seen for follow up his AS  Echo shows moderate - severe AS,  mean AV gradient is Trivial MR   Has chronic dyspnea  No cp  Has seen Dr. Sherene Sires for his pulmonary issues Will be changing to see Dr. Colletta Maryland soon     Past Medical History:  Diagnosis Date   Heart murmur    Nervous stomach     Past Surgical History:  Procedure Laterality Date   COLONOSCOPY     CYSTECTOMY     left leg   POLYPECTOMY      Current Medications: Current Meds  Medication Sig   aspirin 81 MG tablet Take 162 mg by mouth daily.    famotidine (PEPCID) 20 MG tablet One after supper   Multiple Vitamins-Minerals (MULTIVITAMIN WITH MINERALS) tablet Take 1 tablet by mouth daily.   pantoprazole (PROTONIX) 40 MG tablet TAKE 1 TABLET(40 MG) BY MOUTH DAILY 30 TO 60 MINUTES BEFORE FIRST MEAL OF THE DAY     Allergies:   Patient has no known allergies.   Social History   Socioeconomic History   Marital status: Married    Spouse name: Not on file   Number of children: Not on  file   Years of education: Not on file   Highest education level: Not on file  Occupational History   Occupation: Nurse, children's  Tobacco Use   Smoking status: Former    Current packs/day: 0.00    Average packs/day: 0.5 packs/day for 5.0 years (2.5 ttl pk-yrs)    Types: Cigarettes    Start date: 70    Quit date: 92    Years since quitting: 52.6   Smokeless tobacco: Never   Tobacco comments:    in college  Vaping Use   Vaping status: Never Used  Substance and Sexual Activity   Alcohol use: No    Alcohol/week: 0.0 standard drinks of alcohol   Drug use: No   Sexual activity: Yes  Other Topics Concern   Not on file  Social History Narrative   Married.   Education: [employer]   Exercise: Yes   Social Determinants of Health   Financial Resource Strain: Low Risk  (02/27/2022)   Overall Financial Resource Strain (CARDIA)    Difficulty of Paying Living Expenses: Not hard at all  Food Insecurity: No Food Insecurity (02/27/2022)  Hunger Vital Sign    Worried About Running Out of Food in the Last Year: Never true    Ran Out of Food in the Last Year: Never true  Transportation Needs: No Transportation Needs (02/27/2022)   PRAPARE - Administrator, Civil Service (Medical): No    Lack of Transportation (Non-Medical): No  Physical Activity: Sufficiently Active (02/27/2022)   Exercise Vital Sign    Days of Exercise per Week: 4 days    Minutes of Exercise per Session: 40 min  Stress: No Stress Concern Present (02/27/2022)   [employer] of Occupational Health - Occupational Stress Questionnaire    Feeling of Stress : Not at all  Social Connections: Socially Integrated (02/27/2022)   Social Connection and Isolation Panel [NHANES]    Frequency of Communication with Friends and Family: More than three times a week    Frequency of Social Gatherings with Friends and Family: Once a week    Attends Religious Services: More than 4 times per year    Active Member  of [employer] or Organizations: Yes    Attends Engineer, structural: More than 4 times per year    Marital Status: Married     Family History: The patient's family history includes Diabetes in his mother and sister; Healthy in his brother, brother, father, maternal grandfather, maternal grandmother, paternal grandfather, paternal grandmother, and sister; Stroke in his mother. There is no history of Colon cancer, Esophageal cancer, Prostate cancer, Rectal cancer, or Stomach cancer.  ROS:   Please see the history of present illness.     All other systems reviewed and are negative.  EKGs/Labs/Other Studies Reviewed:    The following studies were reviewed today:   EKG:    EKG Interpretation Date/Time:  Tuesday November 12 2022 09:10:04 EDT Ventricular Rate:  89 PR Interval:  148 QRS Duration:  94 QT Interval:  368 QTC Calculation: 447 R Axis:   14  Text Interpretation: Normal sinus rhythm Left ventricular hypertrophy with repolarization abnormality ( R in aVL , Sokolow-Lyon , Cornell product , Romhilt-Estes ) When compared with ECG of 02-Oct-2021 09:51, the anterior lateral repol changes are more prominant since previous ECG Confirmed by Kristeen Miss ([id number]) on 11/12/2022 9:51:24 AM     Recent Labs: 04/10/2022: TSH 1.06 04/30/2022: Hemoglobin 13.8; Platelets 450.0 10/23/2022: ALT 11; BUN 17; Creatinine, Ser 0.81; Potassium 4.9; Sodium 134  Recent Lipid Panel    Component Value Date/Time   CHOL 155 10/23/2022 1432   TRIG 87.0 10/23/2022 1432   HDL 44.40 10/23/2022 1432   CHOLHDL 3 10/23/2022 1432   VLDL 17.4 10/23/2022 1432   LDLCALC 93 10/23/2022 1432     Risk Assessment/Calculations:                Physical Exam:    Physical Exam: Blood pressure 100/64, pulse 86, height 5\' 10"  (1.778 m), weight 133 lb 3.2 oz (60.4 kg), SpO2 93%.       GEN:  W thin, elderly gentleman, no acute distress in no acute distress HEENT: Normal NECK: No JVD; No carotid  bruits LYMPHATICS: No lymphadenopathy CARDIAC: RRR ,  2-3/6 systolic murmur  RESPIRATORY:   limited basilar rales ,  reduced breath sounds bilaterally  ABDOMEN: Soft, non-tender, non-distended MUSCULOSKELETAL:  No edema; No deformity  SKIN: Warm and dry NEUROLOGIC:  Alert and oriented x 3   ASSESSMENT:    1. Aortic valve stenosis, etiology of cardiac valve disease unspecified     PLAN:  Aortic stenosis :   He has at least moderate aortic stenosis by echo.  Will repeat his echo in 6 months and I will see him again following that visit. He potentially is a candidate for TAVR depending on the course of his pulmonary fibrosis.   Will get a repeat echo and make our assessment at that time.   Pulmonary fibrosis: He will be transferring to see Dr. Kalman Shan for his pulmonary fibrosis.                  Medication Adjustments/Labs and Tests Ordered: Current medicines are reviewed at length with the patient today.  Concerns regarding medicines are outlined above.  Orders Placed This Encounter  Procedures   EKG 12-Lead   ECHOCARDIOGRAM COMPLETE   No orders of the defined types were placed in this encounter.   Patient Instructions  Medication Instructions:  Your physician recommends that you continue on your current medications as directed. Please refer to the Current Medication list given to you today.  *If you need a refill on your cardiac medications before your next appointment, please call your pharmacy*  Lab Work: NONE If you have labs (blood work) drawn today and your tests are completely normal, you will receive your results only by: MyChart Message (if you have MyChart) OR A paper copy in the mail If you have any lab test that is abnormal or we need to change your treatment, we will call you to review the results.  Testing/Procedures: ECHO in 6 months Your physician has requested that you have an echocardiogram. Echocardiography is a painless test  that uses sound waves to create images of your heart. It provides your doctor with information about the size and shape of your heart and how well your heart's chambers and valves are working. This procedure takes approximately one hour. There are no restrictions for this procedure. Please do NOT wear cologne, perfume, aftershave, or lotions (deodorant is allowed). Please arrive 15 minutes prior to your appointment time.  Follow-Up: At [facility], you and your health needs are our priority.  As part of our continuing mission to provide you with exceptional heart care, we have created designated Provider Care Teams.  These Care Teams include your primary Cardiologist (physician) and Advanced Practice Providers (APPs -  Physician Assistants and Nurse Practitioners) who all work together to provide you with the care you need, when you need it.  Your next appointment:   7 month(s)  Provider:   Kristeen Miss, MD        Signed, Kristeen Miss, MD  11/12/2022 9:50 AM    [facility]

## 2022-11-12 ENCOUNTER — Encounter: Payer: Self-pay | Admitting: Cardiovascular Disease

## 2022-11-12 ENCOUNTER — Ambulatory Visit: Payer: Medicare PPO | Attending: Cardiovascular Disease | Admitting: Cardiovascular Disease

## 2022-11-12 VITALS — BP 100/64 | HR 86 | Ht 70.0 in | Wt 133.2 lb

## 2022-11-12 DIAGNOSIS — I350 Nonrheumatic aortic (valve) stenosis: Secondary | ICD-10-CM | POA: Diagnosis not present

## 2022-11-12 NOTE — Patient Instructions (Signed)
Medication Instructions:  Your physician recommends that you continue on your current medications as directed. Please refer to the Current Medication list given to you today.  *If you need a refill on your cardiac medications before your next appointment, please call your pharmacy*  Lab Work: NONE If you have labs (blood work) drawn today and your tests are completely normal, you will receive your results only by: MyChart Message (if you have MyChart) OR A paper copy in the mail If you have any lab test that is abnormal or we need to change your treatment, we will call you to review the results.  Testing/Procedures: ECHO in 6 months Your physician has requested that you have an echocardiogram. Echocardiography is a painless test that uses sound waves to create images of your heart. It provides your doctor with information about the size and shape of your heart and how well your heart's chambers and valves are working. This procedure takes approximately one hour. There are no restrictions for this procedure. Please do NOT wear cologne, perfume, aftershave, or lotions (deodorant is allowed). Please arrive 15 minutes prior to your appointment time.  Follow-Up: At [facility], you and your health needs are our priority.  As part of our continuing mission to provide you with exceptional heart care, we have created designated Provider Care Teams.  These Care Teams include your primary Cardiologist (physician) and Advanced Practice Providers (APPs -  Physician Assistants and Nurse Practitioners) who all work together to provide you with the care you need, when you need it.  Your next appointment:   7 month(s)  Provider:   Kristeen Miss, MD

## 2022-12-03 ENCOUNTER — Encounter: Payer: Self-pay | Admitting: Internal Medicine

## 2022-12-03 ENCOUNTER — Ambulatory Visit: Payer: Medicare PPO | Admitting: Internal Medicine

## 2022-12-03 VITALS — BP 100/60 | HR 84 | Wt 135.2 lb

## 2022-12-03 DIAGNOSIS — R590 Localized enlarged lymph nodes: Secondary | ICD-10-CM | POA: Diagnosis not present

## 2022-12-03 DIAGNOSIS — R0609 Other forms of dyspnea: Secondary | ICD-10-CM

## 2022-12-03 DIAGNOSIS — J849 Interstitial pulmonary disease, unspecified: Secondary | ICD-10-CM | POA: Diagnosis not present

## 2022-12-03 DIAGNOSIS — R634 Abnormal weight loss: Secondary | ICD-10-CM

## 2022-12-03 DIAGNOSIS — R0902 Hypoxemia: Secondary | ICD-10-CM

## 2022-12-03 DIAGNOSIS — R64 Cachexia: Secondary | ICD-10-CM | POA: Diagnosis not present

## 2022-12-03 DIAGNOSIS — R683 Clubbing of fingers: Secondary | ICD-10-CM

## 2022-12-03 LAB — BRAIN NATRIURETIC PEPTIDE: Pro B Natriuretic peptide (BNP): 36 pg/mL (ref 0.0–100.0)

## 2022-12-03 LAB — SEDIMENTATION RATE: Sed Rate: 107 mm/hr — ABNORMAL HIGH (ref 0–20)

## 2022-12-03 NOTE — Patient Instructions (Addendum)
ICD-10-CM   1. ILD (interstitial lung disease) (HCC)  J84.9     2. Clubbing of fingers  R68.3     3. Abnormal weight loss  R63.4     4. Cachexia (HCC)  R64       #ILD   - I am concerned you  have Interstitial Lung Disease (ILD) aka Pumonary Fibrosis (PF)  -  There are MANY varieties of this - To narrow down possibilities and assess severity please do the following  PLAN  - take ILD questionnaire packet with you and bring it back next time - do spirometry and dlco  - do walking test OR SIT/STAND test on room air in the office (not 6 min walk test)  - do overnight oxygen test on room air  - - do following blood work - autoimmune panel: Serum: ESR, ANA, DS-DNA, RF, anti-CCP, ssA, ssB, scl-70, Total CK,  Aldolase,   - do serum Hypersensitivity Pneumonitis Panel   - do blood  Quantiferon Gold    - do serum BNP   - do HIV    - start 2L [place] o2 at rest   #Mediasinal adenopathy  > 2cm  Plan  - pet scan  Followup  - 2-4 weeks with Dr Marchelle Gearing 30 min vsiit but after completing aboveo

## 2022-12-03 NOTE — Progress Notes (Signed)
OV 12/03/2022-   Subjective:  Patient ID: Eric Ferrell., male , DOB: 04-25-1940 , age 82 y.o. , MRN: [id number] , ADDRESS: [address] [place] [address] PCP Shade Flood, MD Patient Care Team: Shade Flood, MD as PCP - General (Family Medicine) Nahser, Deloris Ping, MD as PCP - Cardiology (Cardiology)  This Provider for this visit: Treatment Team:  Attending Provider: Kalman Shan, MD    12/03/2022 -   Chief Complaint  Patient presents with   Hospitalization Follow-up    Having SOB, cough sometimes.     HPI Eric Ferrell. 82 y.o. -++ present with his son Eric Ferrell.  The latter is an independent historian.  He is referred for ILD.  He is a transfer of care from Dr. Casimiro Needle work.  Patient lives in [place].  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  He tells me that in November 2023 1 night they sent him out to work in the cold air.  He said he was not properly dressed.  And then he said he almost froze.  By the morning he was quite short of breath and since then he has had shortness of breath.  He does not feel like he is getting worse.  In review of his images even chest x-ray in 2021 shows presence of possible ILD.  Definitely present in the summer 2023 before his cold air exposure.  It is currently worse in March 2024.  I personally visualized this and showed it to him.  According to radiology to his UIP.  I concur.  Dyspnea is present with exertion relieved by rest.    His cough is not a big symptom is only mild and occasional.  The other issue is that he is having weight loss.  Is lost around 15 pounds of weight.  Is pretty cachectic with a BMI of 19.  No HIV test on lab review.  Thyroid function studies are normal in 2024.   Other issue that CT scan does  show mediastinal adenopathy which radiology feels is negative but it is significant in size.   He is a non-smoker trying it out in college.  He denies any mold exposure in his workplace or at home.  He has not done the ILD questionnaire  He has stenosis although he says he does have a heart murmur since childhood.  He says that his mother did not let him play sports because of this.  He is wondering if his illness could be because of childhood issues.   Same and notices pulse ox at 88% on room air at rest.++  - FENO  04/30/2022   =  35 on no RX  - Allergy screen 04/30/2022 >  Eos 0.2 /  IgE  108  Echo 06/13/22  AS  mod to severe   CT Chest data from date: [facility] 07/17/22  - personally visualized and independently interpreted : yes - my findings are: AS below  Narrative & Impression  CLINICAL DATA:  Cough, shortness of breath   EXAM: CT CHEST WITHOUT CONTRAST   TECHNIQUE: Multidetector CT imaging of the chest was performed following the standard protocol without intravenous contrast. High resolution imaging of the lungs, as well as inspiratory and expiratory imaging, was performed.  RADIATION DOSE REDUCTION: This exam was performed according to the departmental dose-optimization program which includes automated exposure control, adjustment of the mA and/or kV according to patient size and/or use of iterative reconstruction technique.   COMPARISON:  10/03/1998   FINDINGS: Cardiovascular: Aortic atherosclerosis. Dense aortic valve calcifications. Normal heart size. Three-vessel coronary artery calcifications. No pericardial effusion.   Mediastinum/Nodes: Numerous enlarged mediastinal lymph nodes, pretracheal nodes measuring up to 2.0 x 1.5 cm (series 2, image 53). Thyroid gland, trachea, and esophagus demonstrate no significant findings.   Lungs/Pleura: Severe pulmonary fibrosis in a pattern with apical to basal gradient and asymmetrically worse in the right  lung, characterized by irregular peripheral interstitial opacity, septal thickening, traction bronchiectasis, subpleural bronchiolectasis, and large areas of honeycombing throughout, particularly in the right lung base. Fibrotic findings are significantly worsened compared to prior examination dated 10/02/2021. No significant air trapping on expiratory phase imaging. No pleural effusion or pneumothorax.   Upper Abdomen: No acute abnormality.   Musculoskeletal: No chest wall abnormality. No acute osseous findings.   IMPRESSION: 1. Severe pulmonary fibrosis in a pattern with apical to basal gradient and asymmetrically worse in the right lung, characterized by irregular peripheral interstitial opacity, septal thickening, traction bronchiectasis, subpleural bronchiolectasis, and large areas of honeycombing throughout, particularly in the right lung base. Fibrotic findings are significantly worsened compared to prior examination dated 10/02/2021. Findings are consistent with UIP per consensus guidelines: Diagnosis of Idiopathic Pulmonary Fibrosis: An Official ATS/ERS/JRS/ALAT Clinical Practice Guideline. Am Rosezetta Schlatter Crit Care Med Vol 198, Iss 5, [phone], Dec 07 2016. 2. Numerous enlarged mediastinal lymph nodes, unchanged, likely reactive fibrosis. 3. Coronary artery disease. 4. Dense aortic valve calcifications. Correlate for echocardiographic evidence of aortic valve dysfunction.   Aortic Atherosclerosis (ICD10-I70.0).     Electronically Signed   By: Jearld Lesch M.D.   On: 07/19/2022 14:38      ECHO 06/13/22     Sonographer Comments: Technically difficult study due to poor echo  windows. Image acquisition challenging due to patient body habitus.  IMPRESSIONS     1. Left ventricular ejection fraction, by estimation, is 65 to 70%. The  left ventricle has normal function. The left ventricle has no regional  wall motion abnormalities. There is mild left ventricular  hypertrophy.  Left ventricular diastolic parameters  are consistent with Grade I diastolic dysfunction (impaired relaxation).   2. Right ventricular systolic function is normal. The right ventricular  size is normal.   3. The mitral valve is abnormal. Trivial mitral valve regurgitation.   4. The aortic valve is calcified. There is severe calcifcation of the  aortic valve. Aortic valve regurgitation is trivial. Moderate to severe  aortic valve stenosis. Aortic regurgitation PHT measures 616 msec. Aortic  valve area, by VTI measures 0.71 cm.   Aortic valve mean gradient measures 27.0 mmHg. Aortic valve Vmax measures  3.39 m/s. Peak gradient 46 mmHg. DI is 0.31.   Comparison(s): No prior Echocardiogram.   PFT      No data to display            LAB RESULTS last 96 hours No results found.  LAB RESULTS last 90 days Recent Results (from the past 2160 hour(s))  Comprehensive metabolic panel     Status: Abnormal   Collection Time: 10/23/22  2:32 PM  Result Value Ref Range   Sodium 134 (L) 135 - 145 mEq/L   Potassium 4.9 3.5 - 5.1 mEq/L   Chloride 98 96 - 112 mEq/L   CO2 33 (  H) 19 - 32 mEq/L   Glucose, Bld 129 (H) 70 - 99 mg/dL   BUN 17 6 - 23 mg/dL   Creatinine, Ser 4.09 0.40 - 1.50 mg/dL   Total Bilirubin 0.3 0.2 - 1.2 mg/dL   Alkaline Phosphatase 64 39 - 117 U/L   AST 24 0 - 37 U/L   ALT 11 0 - 53 U/L   Total Protein 8.9 (H) 6.0 - 8.3 g/dL   Albumin 3.8 3.5 - 5.2 g/dL   GFR 81.19 >14.78 mL/min    Comment: Calculated using the CKD-EPI Creatinine Equation (2021)   Calcium 9.9 8.4 - 10.5 mg/dL  Lipid panel     Status: None   Collection Time: 10/23/22  2:32 PM  Result Value Ref Range   Cholesterol 155 0 - 200 mg/dL    Comment: ATP Ferrell Classification       Desirable:  < 200 mg/dL               Borderline High:  200 - 239 mg/dL          High:  > = 295 mg/dL   Triglycerides 62.1 0.0 - 149.0 mg/dL    Comment: Normal:  <308 mg/dLBorderline High:  150 - 199 mg/dL   HDL 65.78  >46.96 mg/dL   VLDL 29.5 0.0 - 28.4 mg/dL   LDL Cholesterol 93 0 - 99 mg/dL   Total CHOL/HDL Ratio 3     Comment:                Men          Women1/2 Average Risk     3.4          3.3Average Risk          5.0          4.42X Average Risk          9.6          7.13X Average Risk          15.0          11.0                       NonHDL 110.66     Comment: NOTE:  Non-HDL goal should be 30 mg/dL higher than patient's LDL goal (i.e. LDL goal of < 70 mg/dL, would have non-HDL goal of < 100 mg/dL)  Hemoglobin X3K     Status: None   Collection Time: 10/23/22  2:32 PM  Result Value Ref Range   Hgb A1c MFr Bld 6.2 4.6 - 6.5 %    Comment: Glycemic Control Guidelines for People with Diabetes:Non Diabetic:  <6%Goal of Therapy: <7%Additional Action Suggested:  >8%          has a past medical history of Heart murmur and Nervous stomach.   reports that he quit smoking about 52 years ago. His smoking use included cigarettes. He started smoking about 57 years ago. He has a 2.5 pack-year smoking history. He has never used smokeless tobacco.  Past Surgical History:  Procedure Laterality Date   COLONOSCOPY     CYSTECTOMY     left leg   POLYPECTOMY      No Known Allergies  Immunization History  Administered Date(s) Administered   Fluad Quad(high Dose 65+) 04/22/2019, 01/01/2021, 02/20/2022   Influenza, High Dose Seasonal PF 04/15/2018   Influenza,inj,Quad PF,6+ Mos 03/13/2015, 01/11/2016, 03/21/2017   Influenza-Unspecified 04/08/2013   PFIZER(Purple Top)SARS-COV-2 Vaccination 05/23/2019,  06/15/2019, 01/22/2020, 01/03/2021   PNEUMOCOCCAL CONJUGATE-20 04/10/2022   Pneumococcal Conjugate-13 03/13/2015   Pneumococcal Polysaccharide-23 04/11/2013   Tdap 03/13/2015   Zoster Recombinant(Shingrix) 10/15/2017, 04/09/2018   Zoster, Live 04/08/2014    Family History  Problem Relation Age of Onset   Stroke Mother    Diabetes Mother    Healthy Father    Diabetes Sister    Healthy Brother     Healthy Maternal Grandmother    Healthy Maternal Grandfather    Healthy Paternal Grandmother    Healthy Paternal Grandfather    Healthy Brother    Healthy Sister    Colon cancer Neg Hx    Esophageal cancer Neg Hx    Prostate cancer Neg Hx    Rectal cancer Neg Hx    Stomach cancer Neg Hx      Current Outpatient Medications:    aspirin 81 MG tablet, Take 162 mg by mouth daily. , Disp: , Rfl:    famotidine (PEPCID) 20 MG tablet, One after supper, Disp: 30 tablet, Rfl: 11   Multiple Vitamins-Minerals (MULTIVITAMIN WITH MINERALS) tablet, Take 1 tablet by mouth daily., Disp: , Rfl:    pantoprazole (PROTONIX) 40 MG tablet, TAKE 1 TABLET(40 MG) BY MOUTH DAILY 30 TO 60 MINUTES BEFORE FIRST MEAL OF THE DAY, Disp: 30 tablet, Rfl: 2      Objective:   Vitals:   12/03/22 1043  BP: 100/60  Pulse: 84  SpO2: (!) 88%  Weight: 135 lb 3.2 oz (61.3 kg)    Estimated body mass index is 19.4 kg/m as calculated from the following:   Height as of 11/12/22: 5\' 10"  (1.778 m).   Weight as of this encounter: 135 lb 3.2 oz (61.3 kg).  @WEIGHTCHANGE @  [employer]   12/03/22 1043  Weight: 135 lb 3.2 oz (61.3 kg)     Physical Exam   General: No distress. cachectic O2 at rest: no Cane present: no Sitting in wheel chair: no Frail: YES Obese: NO Neuro: Alert and Oriented x 3. GCS 15. Speech normal Psych: Pleasant Resp:  Barrel Chest - no.  Wheeze - no, Crackles - YES BASE. TACHYPENIC. USES ACC MUSCLEs, No overt respiratory distress CVS: Normal heart sounds. Murmurs - no Ext: Stigmata of Connective Tissue Disease - no HEENT: Normal upper airway. PEERL +. No post nasal drip        Assessment:       ICD-10-CM   1. ILD (interstitial lung disease) (HCC)  J84.9     2. Hypoxemia  R09.02     3. Clubbing of fingers  R68.3     4. Abnormal weight loss  R63.4     5. Cachexia (HCC)  R64     6. Mediastinal adenopathy  R59.0          Plan:     Patient Instructions     ICD-10-CM    1. ILD (interstitial lung disease) (HCC)  J84.9     2. Clubbing of fingers  R68.3     3. Abnormal weight loss  R63.4     4. Cachexia (HCC)  R64       #ILD   - I am concerned you  have Interstitial Lung Disease (ILD) aka Pumonary Fibrosis (PF)  -  There are MANY varieties of this - To narrow down possibilities and assess severity please do the following  PLAN  - take ILD questionnaire packet with you and bring it back next time - do spirometry and dlco  - do  walking test OR SIT/STAND test on room air in the office (not 6 min walk test)  - do overnight oxygen test on room air  - - do following blood work - autoimmune panel: Serum: ESR, ANA, DS-DNA, RF, anti-CCP, ssA, ssB, scl-70, Total CK,  Aldolase,   - do serum Hypersensitivity Pneumonitis Panel   - do blood  Quantiferon Gold    - do serum BNP   - do HIV    - start 2L [place] o2 at rest   #Mediasinal adenopathy  > 2cm  Plan  - pet scan  Followup  - 2-4 weeks with Dr Marchelle Gearing 30 min vsiit but after completing aboveo   FOLLOWUP Return in about 4 weeks (around 12/31/2022) for 30 min visit, after Cleda Daub and DLCO, ILD, with Dr Marchelle Gearing, Face to Face Visit.   .( Level 05 visit E&M 2024: Estb >= 40 min visit type: on-site physical face to visit  in total care time and counseling or/and coordination of care by this undersigned MD - Dr Kalman Shan. This includes one or more of the following on this same day 12/03/2022: pre-charting, chart review, note writing, documentation discussion of test results, diagnostic or treatment recommendations, prognosis, risks and benefits of management options, instructions, education, compliance or risk-factor reduction. It excludes time spent by the CMA or office staff in the care of the patient. Actual time 45 min)    SIGNATURE    Dr. Kalman Shan, M.D., F.C.C.P,  Pulmonary and Critical Care Medicine Staff Physician, [facility] Director - Interstitial Lung Disease   Program  Pulmonary Fibrosis [facility] at [facility] [place], [place], [address]  Pager: [phone], If no answer or between  15:00h - 7:00h: call [phone] Telephone: [phone]  11:19 AM 12/03/2022

## 2022-12-04 ENCOUNTER — Encounter: Payer: Self-pay | Admitting: Family Medicine

## 2022-12-04 ENCOUNTER — Ambulatory Visit: Payer: Medicare PPO | Admitting: Family Medicine

## 2022-12-04 VITALS — BP 106/60 | HR 52 | Temp 97.9°F | Ht 70.0 in | Wt 133.8 lb

## 2022-12-04 DIAGNOSIS — R634 Abnormal weight loss: Secondary | ICD-10-CM

## 2022-12-04 DIAGNOSIS — J8410 Pulmonary fibrosis, unspecified: Secondary | ICD-10-CM | POA: Diagnosis not present

## 2022-12-04 NOTE — Patient Instructions (Signed)
I will keep an eye out for results on labs that were ordered by your pulmonologist from yesterday.  They did order oxygen for you to start, and would recommend getting that as soon as possible.  We have reached out to the staff from pulmonary from your visit yesterday to help figure out how to get you oxygen as soon as possible.  If you have any new or worsening shortness of breath prior to receiving oxygen, call 911 or go to the emergency room.

## 2022-12-04 NOTE — Progress Notes (Signed)
Subjective:  Patient ID: Eric Rowan., male    DOB: 27-Mar-1941  Age: 82 y.o. MRN: [id number]  CC:  Chief Complaint  Patient presents with   Follow-up    Pt notes Eric Ferrell was doing okay but felt very drained after seeing specialist the other day where they took 8 vials of blood, notes short of breathe is worse since that time     HPI Eric Ferrell. presents for   Pulmonary fibrosis Office visit July 17.  Initial O2 sat 89% at that time. Had been followed by pulmonary recently with recommendation to adjust pace of activity given PAF and underlying aortic stenosis.  Plan for pulmonary fibrosis clinic follow-up.  Coarse breath sounds on his lungs on exam at that time bilaterally at the bases.  89% pulse ox on rest with ambulatory pulse ox 89% to 91%.  Weight had decreased, thought to be in part due to his underlying lung disorder.  Eric Ferrell was seen by cardiology on August 6.  Moderate aortic stenosis, plan for 40-month repeat echo, with possible candidacy for TAVR depending on the course of pulmonary fibrosis.  Eric Ferrell was seen yesterday by Dr. Marchelle Gearing with pulmonary.  Plan for spirometry and DLCO, ILD questionnaire packet, plan for walking test or sit/stand test on room air in the office, overnight oximetry, and labs ordered.  Started on 2 L oxygen at rest.  PET scan ordered for mediastinal adenopathy.  2 to 4-week follow-up with Dr. Marchelle Gearing.   Rested after visit yesterday. Tired after bloodwork. Minimal change in shortness of breath- some shortness of breath since yesterday. Home 02 sat will decrease then come up to 90-91%.   Has not heard about oxygen yet.   3:06 PM Repeat O2sat 91%RA.   History Patient Active Problem List   Diagnosis Date Noted   Pulmonary fibrosis (HCC) 05/13/2022   Aortic valve stenosis 05/13/2022   DOE (dyspnea on exertion) 04/30/2022   Chronic cough 04/30/2022   Past Medical History:  Diagnosis Date   Heart murmur    Nervous stomach    Past Surgical History:   Procedure Laterality Date   COLONOSCOPY     CYSTECTOMY     left leg   POLYPECTOMY     No Known Allergies Prior to Admission medications   Medication Sig Start Date End Date Taking? Authorizing Provider  aspirin 81 MG tablet Take 162 mg by mouth daily.    Yes [provider]  famotidine (PEPCID) 20 MG tablet One after supper 04/30/22  Yes Nyoka Cowden, MD  Multiple Vitamins-Minerals (MULTIVITAMIN WITH MINERALS) tablet Take 1 tablet by mouth daily.   Yes [provider]  pantoprazole (PROTONIX) 40 MG tablet TAKE 1 TABLET(40 MG) BY MOUTH DAILY 30 TO 60 MINUTES BEFORE FIRST MEAL OF THE DAY 07/26/22  Yes Nyoka Cowden, MD   Social History   Socioeconomic History   Marital status: Married    Spouse name: Not on file   Number of children: Not on file   Years of education: Not on file   Highest education level: Not on file  Occupational History   Occupation: Nurse, children's  Tobacco Use   Smoking status: Former    Current packs/day: 0.00    Average packs/day: 0.5 packs/day for 5.0 years (2.5 ttl pk-yrs)    Types: Cigarettes    Start date: 59    Quit date: 60    Years since quitting: 52.6   Smokeless tobacco: Never   Tobacco comments:  in college  Vaping Use   Vaping status: Never Used  Substance and Sexual Activity   Alcohol use: No    Alcohol/week: 0.0 standard drinks of alcohol   Drug use: No   Sexual activity: Yes  Other Topics Concern   Not on file  Social History Narrative   Married.   Education: [employer]   Exercise: Yes   Social Determinants of Health   Financial Resource Strain: Low Risk  (02/27/2022)   Overall Financial Resource Strain (CARDIA)    Difficulty of Paying Living Expenses: Not hard at all  Food Insecurity: No Food Insecurity (02/27/2022)   Hunger Vital Sign    Worried About Running Out of Food in the Last Year: Never true    Ran Out of Food in the Last Year: Never true  Transportation Needs: No Transportation  Needs (02/27/2022)   PRAPARE - Administrator, Civil Service (Medical): No    Lack of Transportation (Non-Medical): No  Physical Activity: Sufficiently Active (02/27/2022)   Exercise Vital Sign    Days of Exercise per Week: 4 days    Minutes of Exercise per Session: 40 min  Stress: No Stress Concern Present (02/27/2022)   [employer] of Occupational Health - Occupational Stress Questionnaire    Feeling of Stress : Not at all  Social Connections: Socially Integrated (02/27/2022)   Social Connection and Isolation Panel [NHANES]    Frequency of Communication with Friends and Family: More than three times a week    Frequency of Social Gatherings with Friends and Family: Once a week    Attends Religious Services: More than 4 times per year    Active Member of [employer] or Organizations: Yes    Attends Banker Meetings: More than 4 times per year    Marital Status: Married  Catering manager Violence: Not At Risk (02/27/2022)   Humiliation, Afraid, Rape, and Kick questionnaire    Fear of Current or Ex-Partner: No    Emotionally Abused: No    Physically Abused: No    Sexually Abused: No    Review of Systems   Objective:   Vitals:   12/04/22 1432 12/04/22 1456  BP: 106/60   Pulse: (!) 52   Temp: 97.9 F (36.6 C)   TempSrc: Temporal   SpO2: (!) 86% 91%  Weight: 133 lb 12.8 oz (60.7 kg)   Height: 5\' 10"  (1.778 m)      Physical Exam Vitals reviewed.  Constitutional:      Appearance: Eric Ferrell is well-developed.  HENT:     Head: Normocephalic and atraumatic.  Neck:     Vascular: No carotid bruit or JVD.  Cardiovascular:     Rate and Rhythm: Normal rate and regular rhythm.     Heart sounds: Normal heart sounds. No murmur heard. Pulmonary:     Effort: Pulmonary effort is normal.     Breath sounds: Rhonchi (coarse breath sounds in bases.) present. No rales.  Musculoskeletal:     Right lower leg: No edema.     Left lower leg: No edema.  Skin:     General: Skin is warm and dry.  Neurological:     Mental Status: Eric Ferrell is alert and oriented to person, place, and time.  Psychiatric:        Mood and Affect: Mood normal.     Assessment & Plan:  Eric Speier. is a 82 y.o. male . Pulmonary fibrosis (HCC)  Weight loss  Suspected pulmonary fibrosis, pulmonary eval noted  yesterday.  Blood work obtained, further testing to help determine severity and other treatment plans.  Oxygen was ordered at 2 L nasal cannula.  Has not received information about oxygen at this point.  Initial O2 sat was low when Eric Ferrell came in, repeat testing improved.  Some of his fatigue, dyspnea is likely related to his underlying pulmonary fibrosis but also reports some fatigue since office visit and blood work yesterday.  As above repeat O2 sat improved.  We did reach out to pulmonary to try to help coordinate getting his oxygen to him today.  ER/911 precautions were given if any acute worsening.  Understanding expressed.  Follow up will be after his pulmonary visit with ER/911 precautions as above.  No orders of the defined types were placed in this encounter.  Patient Instructions  I will keep an eye out for results on labs that were ordered by your pulmonologist from yesterday.  They did order oxygen for you to start, and would recommend getting that as soon as possible.  We have reached out to the staff from pulmonary from your visit yesterday to help figure out how to get you oxygen as soon as possible.  If you have any new or worsening shortness of breath prior to receiving oxygen, call 911 or go to the emergency room.    Signed,   Meredith Staggers, MD [facility], [facility] 12/04/22 3:06 PM

## 2022-12-05 LAB — HIV ANTIBODY (ROUTINE TESTING W REFLEX): HIV 1&2 Ab, 4th Generation: NONREACTIVE

## 2022-12-05 LAB — CK TOTAL AND CKMB (NOT AT ARMC)
CK, MB: <0.7 ng/mL (ref 0–5.0)
Total CK: 26 U/L — ABNORMAL LOW (ref 44–196)

## 2022-12-05 LAB — RHEUMATOID FACTOR: Rheumatoid fact SerPl-aCnc: 38 [IU]/mL — ABNORMAL HIGH (ref <14)

## 2022-12-05 LAB — CYCLIC CITRUL PEPTIDE ANTIBODY, IGG: Cyclic Citrullin Peptide Ab: <16 U

## 2022-12-05 LAB — ALDOLASE: Aldolase: 2.5 U/L (ref <=8.1)

## 2022-12-07 LAB — HYPERSENSITIVITY PNEUMONITIS
A. Pullulans Abs: NEGATIVE
A.Fumigatus #1 Abs: NEGATIVE
Micropolyspora faeni, IgG: NEGATIVE
Pigeon Serum Abs: NEGATIVE
Thermoact. Saccharii: NEGATIVE
Thermoactinomyces vulgaris, IgG: NEGATIVE

## 2022-12-07 LAB — ANA+ENA+DNA/DS+SCL 70+SJOSSA/B
ANA Titer 1: NEGATIVE
ENA RNP Ab: 0.9 AI (ref 0.0–0.9)
ENA SM Ab Ser-aCnc: <0.2 AI (ref 0.0–0.9)
ENA SSA (RO) Ab: <0.2 AI (ref 0.0–0.9)
ENA SSB (LA) Ab: <0.2 AI (ref 0.0–0.9)
Scleroderma (Scl-70) (ENA) Antibody, IgG: <0.2 AI (ref 0.0–0.9)
dsDNA Ab: 2 [IU]/mL (ref 0–9)

## 2022-12-18 ENCOUNTER — Ambulatory Visit (INDEPENDENT_AMBULATORY_CARE_PROVIDER_SITE_OTHER): Payer: Medicare HMO | Admitting: *Deleted

## 2022-12-18 DIAGNOSIS — Z0000 Encounter for general adult medical examination without abnormal findings: Secondary | ICD-10-CM

## 2022-12-18 NOTE — Patient Instructions (Signed)
Mr. Eric Ferrell , Thank you for taking time to come for your Medicare Wellness Visit. I appreciate your ongoing commitment to your health goals. Please review the following plan we discussed and let me know if I can assist you in the future.   Screening recommendations/referrals: Colonoscopy: no longer required Recommended yearly ophthalmology/optometry visit for glaucoma screening and checkup Recommended yearly dental visit for hygiene and checkup  Vaccinations: Influenza vaccine: Education provided Pneumococcal vaccine: up to date Tdap vaccine: up to date Shingles vaccine: up to date       Preventive Care 65 Years and Older, Male Preventive care refers to lifestyle choices and visits with your health care provider that can promote health and wellness. What does preventive care include? A yearly physical exam. This is also called an annual well check. Dental exams once or twice a year. Routine eye exams. Ask your health care provider how often you should have your eyes checked. Personal lifestyle choices, including: Daily care of your teeth and gums. Regular physical activity. Eating a healthy diet. Avoiding tobacco and drug use. Limiting alcohol use. Practicing safe sex. Taking low doses of aspirin every day. Taking vitamin and mineral supplements as recommended by your health care provider. What happens during an annual well check? The services and screenings done by your health care provider during your annual well check will depend on your age, overall health, lifestyle risk factors, and family history of disease. Counseling  Your health care provider may ask you questions about your: Alcohol use. Tobacco use. Drug use. Emotional well-being. Home and relationship well-being. Sexual activity. Eating habits. History of falls. Memory and ability to understand (cognition). Work and work Astronomer. Screening  You may have the following tests or measurements: Height,  weight, and BMI. Blood pressure. Lipid and cholesterol levels. These may be checked every 5 years, or more frequently if you are over 8 years old. Skin check. Lung cancer screening. You may have this screening every year starting at age 73 if you have a 30-pack-year history of smoking and currently smoke or have quit within the past 15 years. Fecal occult blood test (FOBT) of the stool. You may have this test every year starting at age 41. Flexible sigmoidoscopy or colonoscopy. You may have a sigmoidoscopy every 5 years or a colonoscopy every 10 years starting at age 70. Prostate cancer screening. Recommendations will vary depending on your family history and other risks. Hepatitis C blood test. Hepatitis B blood test. Sexually transmitted disease (STD) testing. Diabetes screening. This is done by checking your blood sugar (glucose) after you have not eaten for a while (fasting). You may have this done every 1-3 years. Abdominal aortic aneurysm (AAA) screening. You may need this if you are a current or former smoker. Osteoporosis. You may be screened starting at age 79 if you are at high risk. Talk with your health care provider about your test results, treatment options, and if necessary, the need for more tests. Vaccines  Your health care provider may recommend certain vaccines, such as: Influenza vaccine. This is recommended every year. Tetanus, diphtheria, and acellular pertussis (Tdap, Td) vaccine. You may need a Td booster every 10 years. Zoster vaccine. You may need this after age 21. Pneumococcal 13-valent conjugate (PCV13) vaccine. One dose is recommended after age 92. Pneumococcal polysaccharide (PPSV23) vaccine. One dose is recommended after age 64. Talk to your health care provider about which screenings and vaccines you need and how often you need them. This information is not intended  to replace advice given to you by your health care provider. Make sure you discuss any  questions you have with your health care provider. Document Released: 04/21/2015 Document Revised: 12/13/2015 Document Reviewed: 01/24/2015 Elsevier Interactive Patient Education  2017 [employer].  Fall Prevention in the Home Falls can cause injuries. They can happen to people of all ages. There are many things you can do to make your home safe and to help prevent falls. What can I do on the outside of my home? Regularly fix the edges of walkways and driveways and fix any cracks. Remove anything that might make you trip as you walk through a door, such as a raised step or threshold. Trim any bushes or trees on the path to your home. Use bright outdoor lighting. Clear any walking paths of anything that might make someone trip, such as rocks or tools. Regularly check to see if handrails are loose or broken. Make sure that both sides of any steps have handrails. Any raised decks and porches should have guardrails on the edges. Have any leaves, snow, or ice cleared regularly. Use sand or salt on walking paths during winter. Clean up any spills in your garage right away. This includes oil or grease spills. What can I do in the bathroom? Use night lights. Install grab bars by the toilet and in the tub and shower. Do not use towel bars as grab bars. Use non-skid mats or decals in the tub or shower. If you need to sit down in the shower, use a plastic, non-slip stool. Keep the floor dry. Clean up any water that spills on the floor as soon as it happens. Remove soap buildup in the tub or shower regularly. Attach bath mats securely with double-sided non-slip rug tape. Do not have throw rugs and other things on the floor that can make you trip. What can I do in the bedroom? Use night lights. Make sure that you have a light by your bed that is easy to reach. Do not use any sheets or blankets that are too big for your bed. They should not hang down onto the floor. Have a firm chair that has side  arms. You can use this for support while you get dressed. Do not have throw rugs and other things on the floor that can make you trip. What can I do in the kitchen? Clean up any spills right away. Avoid walking on wet floors. Keep items that you use a lot in easy-to-reach places. If you need to reach something above you, use a strong step stool that has a grab bar. Keep electrical cords out of the way. Do not use floor polish or wax that makes floors slippery. If you must use wax, use non-skid floor wax. Do not have throw rugs and other things on the floor that can make you trip. What can I do with my stairs? Do not leave any items on the stairs. Make sure that there are handrails on both sides of the stairs and use them. Fix handrails that are broken or loose. Make sure that handrails are as long as the stairways. Check any carpeting to make sure that it is firmly attached to the stairs. Fix any carpet that is loose or worn. Avoid having throw rugs at the top or bottom of the stairs. If you do have throw rugs, attach them to the floor with carpet tape. Make sure that you have a light switch at the top of the stairs and  the bottom of the stairs. If you do not have them, ask someone to add them for you. What else can I do to help prevent falls? Wear shoes that: Do not have high heels. Have rubber bottoms. Are comfortable and fit you well. Are closed at the toe. Do not wear sandals. If you use a stepladder: Make sure that it is fully opened. Do not climb a closed stepladder. Make sure that both sides of the stepladder are locked into place. Ask someone to hold it for you, if possible. Clearly mark and make sure that you can see: Any grab bars or handrails. First and last steps. Where the edge of each step is. Use tools that help you move around (mobility aids) if they are needed. These include: Canes. Walkers. Scooters. Crutches. Turn on the lights when you go into a dark area.  Replace any light bulbs as soon as they burn out. Set up your furniture so you have a clear path. Avoid moving your furniture around. If any of your floors are uneven, fix them. If there are any pets around you, be aware of where they are. Review your medicines with your doctor. Some medicines can make you feel dizzy. This can increase your chance of falling. Ask your doctor what other things that you can do to help prevent falls. This information is not intended to replace advice given to you by your health care provider. Make sure you discuss any questions you have with your health care provider. Document Released: 01/19/2009 Document Revised: 08/31/2015 Document Reviewed: 04/29/2014 Elsevier Interactive Patient Education  2017 [employer].

## 2022-12-18 NOTE — Progress Notes (Signed)
Subjective:   Eric Ferrell. is a 82 y.o. male who presents for Medicare Annual/Subsequent preventive examination.  Visit Complete: Virtual  I connected with  Eric Ferrell. on 12/18/22 by a audio enabled telemedicine application and verified that I am speaking with the correct person using two identifiers.  Patient Location: Home  Provider Location: Home Office  I discussed the limitations of evaluation and management by telemedicine. The patient expressed understanding and agreed to proceed.  Patient Medicare AWV questionnaire was completed by the patient on 12-17-2022; I have confirmed that all information answered by patient is correct and no changes since this date.  Vital Signs: Unable to obtain new vitals due to this being a telehealth visit.   Review of Systems     Cardiac Risk Factors include: advanced age (>4men, >34 women);male gender     Objective:    There were no vitals filed for this visit. There is no height or weight on file to calculate BMI.     12/18/2022   11:39 AM 02/27/2022   10:40 AM 10/02/2021    9:51 AM 01/01/2021    1:39 PM 04/22/2019    8:37 AM 02/04/2018    6:22 PM 03/21/2017   11:27 AM  Advanced Directives  Does Patient Have a Medical Advance Directive? No Yes No Yes Yes No No  Type of Advance Directive  Living will  Healthcare Power of [place];Living will Healthcare Power of [place];Living will    Does patient want to make changes to medical advance directive?    Yes (Inpatient - patient defers changing a medical advance directive at this time - Information given)     Would patient like information on creating a medical advance directive? No - Patient declined  No - Patient declined    Yes (MAU/Ambulatory/Procedural Areas - Information given)    Current Medications (verified) Outpatient Encounter Medications as of 12/18/2022  Medication Sig   aspirin 81 MG tablet Take 162 mg by mouth daily.    famotidine (PEPCID) 20 MG tablet One after  supper   Multiple Vitamins-Minerals (MULTIVITAMIN WITH MINERALS) tablet Take 1 tablet by mouth daily.   pantoprazole (PROTONIX) 40 MG tablet TAKE 1 TABLET(40 MG) BY MOUTH DAILY 30 TO 60 MINUTES BEFORE FIRST MEAL OF THE DAY   No facility-administered encounter medications on file as of 12/18/2022.    Allergies (verified) Patient has no known allergies.   History: Past Medical History:  Diagnosis Date   Heart murmur    Nervous stomach    Past Surgical History:  Procedure Laterality Date   COLONOSCOPY     CYSTECTOMY     left leg   POLYPECTOMY     Family History  Problem Relation Age of Onset   Stroke Mother    Diabetes Mother    Healthy Father    Diabetes Sister    Healthy Brother    Healthy Maternal Grandmother    Healthy Maternal Grandfather    Healthy Paternal Grandmother    Healthy Paternal Grandfather    Healthy Brother    Healthy Sister    Colon cancer Neg Hx    Esophageal cancer Neg Hx    Prostate cancer Neg Hx    Rectal cancer Neg Hx    Stomach cancer Neg Hx    Social History   Socioeconomic History   Marital status: Married    Spouse name: Not on file   Number of children: Not on file   Years of education: Not on file  Highest education level: Not on file  Occupational History   Occupation: Nurse, children's  Tobacco Use   Smoking status: Former    Current packs/day: 0.00    Average packs/day: 0.5 packs/day for 5.0 years (2.5 ttl pk-yrs)    Types: Cigarettes    Start date: 84    Quit date: 57    Years since quitting: 52.7   Smokeless tobacco: Never   Tobacco comments:    in college  Vaping Use   Vaping status: Never Used  Substance and Sexual Activity   Alcohol use: No    Alcohol/week: 0.0 standard drinks of alcohol   Drug use: No   Sexual activity: Yes  Other Topics Concern   Not on file  Social History Narrative   Married.   Education: [employer]   Exercise: Yes   Social Determinants of Health   Financial Resource Strain:  Low Risk  (12/18/2022)   Overall Financial Resource Strain (CARDIA)    Difficulty of Paying Living Expenses: Not hard at all  Food Insecurity: No Food Insecurity (12/18/2022)   Hunger Vital Sign    Worried About Running Out of Food in the Last Year: Never true    Ran Out of Food in the Last Year: Never true  Transportation Needs: No Transportation Needs (12/18/2022)   PRAPARE - Administrator, Civil Service (Medical): No    Lack of Transportation (Non-Medical): No  Physical Activity: Sufficiently Active (12/18/2022)   Exercise Vital Sign    Days of Exercise per Week: 3 days    Minutes of Exercise per Session: 150+ min  Stress: No Stress Concern Present (12/18/2022)   [employer] of Occupational Health - Occupational Stress Questionnaire    Feeling of Stress : Not at all  Social Connections: Socially Integrated (12/18/2022)   Social Connection and Isolation Panel [NHANES]    Frequency of Communication with Friends and Family: More than three times a week    Frequency of Social Gatherings with Friends and Family: More than three times a week    Attends Religious Services: More than 4 times per year    Active Member of [employer] or Organizations: Yes    Attends Engineer, structural: More than 4 times per year    Marital Status: Married    Tobacco Counseling Counseling given: Not Answered Tobacco comments: in college   Clinical Intake:  Pre-visit preparation completed: Yes  Pain : No/denies pain     Diabetes: No  How often do you need to have someone help you when you read instructions, pamphlets, or other written materials from your doctor or pharmacy?: 1 - Never  Interpreter Needed?: No  Information entered by :: Remi Haggard LPN   Activities of Daily Living    12/18/2022   11:39 AM 12/17/2022   10:26 AM  In your present state of health, do you have any difficulty performing the following activities:  Hearing? 0 0  Vision? 0 0  Difficulty  concentrating or making decisions? 0 0  Walking or climbing stairs? 0 0  Dressing or bathing? 0 0  Doing errands, shopping? 0 0  Preparing Food and eating ? N N  Using the Toilet? N N  In the past six months, have you accidently leaked urine? N N  Do you have problems with loss of bowel control? N N  Managing your Medications? N N  Managing your Finances? N N  Housekeeping or managing your Housekeeping? N N    Patient Care  Team: Shade Flood, MD as PCP - General (Family Medicine) Nahser, Deloris Ping, MD as PCP - Cardiology (Cardiology)  Indicate any recent Medical Services you may have received from other than [facility] providers in the past year (date may be approximate).     Assessment:   This is a routine wellness examination for Markez.  Hearing/Vision screen Hearing Screening - Comments:: No trouble hearing Vision Screening - Comments:: Up to date [facility]   Goals Addressed             This Visit's Progress    Patient Stated       Continue current lifestyle       Depression Screen    12/18/2022   11:55 AM 05/13/2022    9:48 AM 04/10/2022   12:58 PM 02/27/2022   10:38 AM 03/26/2021   11:47 AM 01/01/2021    1:30 PM 04/22/2019    8:16 AM  PHQ 2/9 Scores  PHQ - 2 Score 0 0 0 0 0 0 0  PHQ- 9 Score 0  3 0 3      Fall Risk    12/18/2022   11:38 AM 12/17/2022   10:26 AM 05/13/2022    9:48 AM 04/10/2022   12:58 PM 02/27/2022   10:34 AM  Fall Risk   Falls in the past year? 0 0 0 0 0  Number falls in past yr: 0  0 0 0  Injury with Fall? 0  0 0 0  Risk for fall due to :   No Fall Risks No Fall Risks   Follow up Falls evaluation completed;Education provided;Falls prevention discussed  Falls evaluation completed Falls evaluation completed Falls evaluation completed;Education provided;Falls prevention discussed    MEDICARE RISK AT HOME: Medicare Risk at Home Any stairs in or around the home?: Yes If so, are there any without handrails?: No Home free of loose throw rugs in  walkways, pet beds, electrical cords, etc?: Yes Adequate lighting in your home to reduce risk of falls?: Yes Life alert?: No Use of a cane, walker or w/c?: No Grab bars in the bathroom?: No Shower chair or bench in shower?: No Elevated toilet seat or a handicapped toilet?: No  TIMED UP AND GO:  Was the test performed?  No    Cognitive Function:        12/18/2022   11:36 AM 02/27/2022   10:35 AM 01/01/2021    1:34 PM 04/15/2018    1:39 PM 03/21/2017   11:39 AM  6CIT Screen  What Year? 0 points 0 points 0 points 0 points 0 points  What month? 0 points 0 points 0 points 0 points 0 points  What time? 0 points  0 points 0 points 0 points  Count back from 20 0 points 2 points 0 points 0 points 0 points  Months in reverse 0 points 0 points 0 points 0 points 0 points  Repeat phrase 0 points 0 points 8 points 0 points 0 points  Total Score 0 points  8 points 0 points 0 points    Immunizations Immunization History  Administered Date(s) Administered   Fluad Quad(high Dose 65+) 04/22/2019, 01/01/2021, 02/20/2022   Influenza, High Dose Seasonal PF 04/15/2018   Influenza,inj,Quad PF,6+ Mos 03/13/2015, 01/11/2016, 03/21/2017   Influenza-Unspecified 04/08/2013   PFIZER(Purple Top)SARS-COV-2 Vaccination 05/23/2019, 06/15/2019, 01/22/2020, 01/03/2021   PNEUMOCOCCAL CONJUGATE-20 04/10/2022   Pneumococcal Conjugate-13 03/13/2015   Pneumococcal Polysaccharide-23 04/11/2013   Tdap 03/13/2015   Zoster Recombinant(Shingrix) 10/15/2017, 04/09/2018   Zoster, Live  04/08/2014    TDAP status: Up to date  Flu Vaccine status: Due, Education has been provided regarding the importance of this vaccine. Advised may receive this vaccine at local pharmacy or Health Dept. Aware to provide a copy of the vaccination record if obtained from local pharmacy or Health Dept. Verbalized acceptance and understanding.  Pneumococcal vaccine status: Up to date  Covid-19 vaccine status: Information provided on how to  obtain vaccines.   Qualifies for Shingles Vaccine? No   Zostavax completed Yes   Shingrix Completed?: Yes  Screening Tests Health Maintenance  Topic Date Due   INFLUENZA VACCINE  07/07/2023 (Originally 11/07/2022)   Medicare Annual Wellness (AWV)  12/18/2023   DTaP/Tdap/Td (2 - Td or Tdap) 03/12/2025   Pneumonia Vaccine 50+ Years old  Completed   Zoster Vaccines- Shingrix  Completed   HPV VACCINES  Aged Out   Colonoscopy  Discontinued   COVID-19 Vaccine  Discontinued    Health Maintenance  There are no preventive care reminders to display for this patient.   Colorectal cancer screening: No longer required.   Lung Cancer Screening: (Low Dose CT Chest recommended if Age 12-80 years, 20 pack-year currently smoking OR have quit w/in 15years.) does not qualify.   Lung Cancer Screening Referral:   Additional Screening:  Hepatitis C Screening: does not qualify; Completed   Vision Screening: Recommended annual ophthalmology exams for early detection of glaucoma and other disorders of the eye. Is the patient up to date with their annual eye exam?  Yes  Who is the provider or what is the name of the office in which the patient attends annual eye exams? [facility] If pt is not established with a provider, would they like to be referred to a provider to establish care? No .   Dental Screening: Recommended annual dental exams for proper oral hygiene    Community Resource Referral / Chronic Care Management: CRR required this visit?  No   CCM required this visit?  No     Plan:     I have personally reviewed and noted the following in the patient's chart:   Medical and social history Use of alcohol, tobacco or illicit drugs  Current medications and supplements including opioid prescriptions. Patient is not currently taking opioid prescriptions. Functional ability and status Nutritional status Physical activity Advanced directives List of other physicians Hospitalizations,  surgeries, and ER visits in previous 12 months Vitals Screenings to include cognitive, depression, and falls Referrals and appointments  In addition, I have reviewed and discussed with patient certain preventive protocols, quality metrics, and best practice recommendations. A written personalized care plan for preventive services as well as general preventive health recommendations were provided to patient.     Remi Haggard, LPN   [id number]/[id number]   After Visit Summary: (MyChart) Due to this being a telephonic visit, the after visit summary with patients personalized plan was offered to patient via MyChart   Nurse Notes:

## 2022-12-19 ENCOUNTER — Ambulatory Visit (HOSPITAL_COMMUNITY)
Admission: RE | Admit: 2022-12-19 | Discharge: 2022-12-19 | Disposition: A | Discharge status: Home / Self Care | Payer: Medicare HMO | Source: Ambulatory Visit | Attending: Internal Medicine | Admitting: Internal Medicine

## 2022-12-19 DIAGNOSIS — I700 Atherosclerosis of aorta: Secondary | ICD-10-CM | POA: Insufficient documentation

## 2022-12-19 DIAGNOSIS — I358 Other nonrheumatic aortic valve disorders: Secondary | ICD-10-CM | POA: Insufficient documentation

## 2022-12-19 DIAGNOSIS — J849 Interstitial pulmonary disease, unspecified: Secondary | ICD-10-CM | POA: Diagnosis not present

## 2022-12-19 DIAGNOSIS — N200 Calculus of kidney: Secondary | ICD-10-CM | POA: Insufficient documentation

## 2022-12-19 DIAGNOSIS — R590 Localized enlarged lymph nodes: Secondary | ICD-10-CM | POA: Diagnosis not present

## 2022-12-19 DIAGNOSIS — J8410 Pulmonary fibrosis, unspecified: Secondary | ICD-10-CM | POA: Diagnosis not present

## 2022-12-19 DIAGNOSIS — K769 Liver disease, unspecified: Secondary | ICD-10-CM | POA: Diagnosis not present

## 2022-12-19 LAB — GLUCOSE, CAPILLARY: Glucose-Capillary: 95 mg/dL (ref 70–99)

## 2022-12-19 MED ORDER — FLUDEOXYGLUCOSE F - 18 (FDG) INJECTION
7.0000 | Freq: Once | INTRAVENOUS | Status: AC | PRN
  Administered 2022-12-19: 6.62 via INTRAVENOUS

## 2022-12-24 ENCOUNTER — Telehealth: Payer: Self-pay | Admitting: Family Medicine

## 2022-12-24 NOTE — Telephone Encounter (Signed)
Placed in folder at Nurse station

## 2022-12-24 NOTE — Telephone Encounter (Signed)
Received forms from [facility] Printed & placed in provider bin

## 2022-12-25 NOTE — Telephone Encounter (Signed)
Paperwork completed and placed in fax bin at back nurse station

## 2023-01-01 ENCOUNTER — Telehealth: Payer: Self-pay | Admitting: Family Medicine

## 2023-01-01 NOTE — Telephone Encounter (Signed)
Looked in folder at front and paper work is not in there. Asked front and was stated someone else may have picked this up.

## 2023-01-01 NOTE — Telephone Encounter (Signed)
[employer]- Order    Chart sheet attached placed in front bin

## 2023-01-02 NOTE — Telephone Encounter (Signed)
Placed in sign folder.

## 2023-01-02 NOTE — Telephone Encounter (Signed)
It's been located and back in the bin

## 2023-01-02 NOTE — Telephone Encounter (Signed)
Paperwork completed and placed in scan bin at back nurse station

## 2023-01-06 ENCOUNTER — Ambulatory Visit: Payer: Medicare HMO | Admitting: Family Medicine

## 2023-01-06 ENCOUNTER — Encounter: Payer: Self-pay | Admitting: Family Medicine

## 2023-01-06 VITALS — BP 98/60 | HR 73 | Temp 97.9°F | Ht 70.0 in | Wt 136.4 lb

## 2023-01-06 DIAGNOSIS — J8410 Pulmonary fibrosis, unspecified: Secondary | ICD-10-CM

## 2023-01-06 NOTE — Progress Notes (Unsigned)
Subjective:  Patient ID: Eric Ferrell., male    DOB: 30-Aug-1940  Age: 82 y.o. MRN: [id number]  CC:  Chief Complaint  Patient presents with   Medical Management of Chronic Issues    HPI Eric Ferrell. presents for    Pulmonary fibrosis Follow-up from August 28th visit.  Repeat O2 sat at that time had improved in office.  Had recent pulmonary eval at that time with suspected pulmonary fibrosis/ILD.  Home oxygen was ordered.  Labs ordered, plan for PET scan.  Spirometry with DLCO. - scheduled 01/14/23 - 8:30am, then appt with pulmonary at 9:30am. Unsure if he had overnight oximetry testing.   PET scan performed on September 12.: IMPRESSION: 1. Mediastinal adenopathy is mildly hypermetabolic and unchanged from 10/02/2021. Findings strongly favor a reactive etiology in the setting of severe interstitial lung disease. 2. Interstitial lung disease, better characterized on 07/17/2022 CT chest. 3. Small right renal stone. 4. Aortic atherosclerosis (ICD10-I70.0). Dense aortic valvular calcification.  Wt Readings from Last 3 Encounters:  01/06/23 136 lb 6.4 oz (61.9 kg)  12/04/22 133 lb 12.8 oz (60.7 kg)  12/03/22 135 lb 3.2 oz (61.3 kg)   Doing well. Walking, shopping.  Never received oxygen. Tried to deliver but issues with them arriving at the door, then they would leave and left a card. He called and they did not know who person was. He never received oxygen and he feels like it was a lost cause. Does not feel like he needs oxygen at this time and denies dyspnea, but has to stop and rest at times - 1/2 block.  No cough.   Denies dyspnea, no fever. Home O2 sat above 90% at home.   History Patient Active Problem List   Diagnosis Date Noted   Pulmonary fibrosis (HCC) 05/13/2022   Aortic valve stenosis 05/13/2022   DOE (dyspnea on exertion) 04/30/2022   Chronic cough 04/30/2022   Past Medical History:  Diagnosis Date   Heart murmur    Nervous stomach    Past Surgical  History:  Procedure Laterality Date   COLONOSCOPY     CYSTECTOMY     left leg   POLYPECTOMY     No Known Allergies Prior to Admission medications   Medication Sig Start Date End Date Taking? Authorizing Provider  aspirin 81 MG tablet Take 162 mg by mouth daily.    Yes [provider]  famotidine (PEPCID) 20 MG tablet One after supper 04/30/22  Yes Nyoka Cowden, MD  Multiple Vitamins-Minerals (MULTIVITAMIN WITH MINERALS) tablet Take 1 tablet by mouth daily.   Yes [provider]  pantoprazole (PROTONIX) 40 MG tablet TAKE 1 TABLET(40 MG) BY MOUTH DAILY 30 TO 60 MINUTES BEFORE FIRST MEAL OF THE DAY 07/26/22  Yes Nyoka Cowden, MD   Social History   Socioeconomic History   Marital status: Married    Spouse name: Not on file   Number of children: Not on file   Years of education: Not on file   Highest education level: Not on file  Occupational History   Occupation: Nurse, children's  Tobacco Use   Smoking status: Former    Current packs/day: 0.00    Average packs/day: 0.5 packs/day for 5.0 years (2.5 ttl pk-yrs)    Types: Cigarettes    Start date: 65    Quit date: 31    Years since quitting: 52.7   Smokeless tobacco: Never   Tobacco comments:    in college  Vaping Use  Vaping status: Never Used  Substance and Sexual Activity   Alcohol use: No    Alcohol/week: 0.0 standard drinks of alcohol   Drug use: No   Sexual activity: Yes  Other Topics Concern   Not on file  Social History Narrative   Married.   Education: [employer]   Exercise: Yes   Social Determinants of Health   Financial Resource Strain: Low Risk  (12/18/2022)   Overall Financial Resource Strain (CARDIA)    Difficulty of Paying Living Expenses: Not hard at all  Food Insecurity: No Food Insecurity (12/18/2022)   Hunger Vital Sign    Worried About Running Out of Food in the Last Year: Never true    Ran Out of Food in the Last Year: Never true  Transportation Needs: No  Transportation Needs (12/18/2022)   PRAPARE - Administrator, Civil Service (Medical): No    Lack of Transportation (Non-Medical): No  Physical Activity: Sufficiently Active (12/18/2022)   Exercise Vital Sign    Days of Exercise per Week: 3 days    Minutes of Exercise per Session: 150+ min  Stress: No Stress Concern Present (12/18/2022)   [employer] of Occupational Health - Occupational Stress Questionnaire    Feeling of Stress : Not at all  Social Connections: Socially Integrated (12/18/2022)   Social Connection and Isolation Panel [NHANES]    Frequency of Communication with Friends and Family: More than three times a week    Frequency of Social Gatherings with Friends and Family: More than three times a week    Attends Religious Services: More than 4 times per year    Active Member of [employer] or Organizations: Yes    Attends Engineer, structural: More than 4 times per year    Marital Status: Married  Catering manager Violence: Not At Risk (12/18/2022)   Humiliation, Afraid, Rape, and Kick questionnaire    Fear of Current or Ex-Partner: No    Emotionally Abused: No    Physically Abused: No    Sexually Abused: No    Review of Systems   Objective:   Vitals:   01/06/23 1311 01/06/23 1424  BP: (!) 94/58 98/60  Pulse: 73   Temp: 97.9 F (36.6 C)   TempSrc: Oral   SpO2: (!) 89%   Weight: 136 lb 6.4 oz (61.9 kg)   Height: 5\' 10"  (1.778 m)   Denied lightheadedness or dizziness with above blood pressure and denies dyspnea at this time.  BP Readings from Last 3 Encounters:  01/06/23 98/60  12/04/22 106/60  12/03/22 100/60   Repeat O2 sat on room air, 88 to 90%.  Physical Exam Vitals reviewed.  Constitutional:      General: He is not in acute distress.    Appearance: He is well-developed. He is not ill-appearing, toxic-appearing or diaphoretic.  HENT:     Head: Normocephalic and atraumatic.  Neck:     Vascular: No carotid bruit or JVD.   Cardiovascular:     Rate and Rhythm: Normal rate and regular rhythm.     Heart sounds: Murmur heard.  Pulmonary:     Effort: Pulmonary effort is normal.     Breath sounds: No rales.     Comments: Few scattered coarse breath sounds. Musculoskeletal:     Right lower leg: No edema.     Left lower leg: No edema.  Skin:    General: Skin is warm and dry.  Neurological:     Mental Status: He is alert  and oriented to person, place, and time.  Psychiatric:        Mood and Affect: Mood normal.      Assessment & Plan:  Eric Colt. is a 82 y.o. male . Pulmonary fibrosis (HCC)  -Encouraged by his improvement in symptoms, minimal impact on activities this time.  Takes breaks after long walking as above.  PET scan noted and reviewed results with patient including details regarding pulmonary fibrosis/interstitial lung disease.  We discussed this is the likely cause of his dyspnea and hypoxia.  I do not see an overnight oximetry performed, so we will need to check into the status of that study, but also has follow-up soon for spirometry and pulmonary appointment.  He declines oxygen supplementation at this time.  Discussed in office readings, and likely lower readings overnight. We discussed possible hypoxia impact on cerebrovascular, cardiovascular health with recurrent hypoxia.  Understanding was expressed but chooses not to pursue oxygen at this time.  ER precautions given if any increased dyspnea given borderline readings when he is feeling well currently.  All questions were answered.  Will send a copy of this note to his pulmonologist  Blood pressure borderline but has been on the lower side previously and asymptomatic, will continue to monitor.  No orders of the defined types were placed in this encounter.  Patient Instructions  Blood pressure is on the lower side here today.  If you have any lightheadedness or dizziness, be seen right away.  I appreciate you coming today and I am happy  to hear that you are feeling better.   However your oxygen level was also low on repeat testing today.  Initially it was 89%, and when I rechecked it it was between 88 to 90%.  As we discussed that oxygen may go lower at night when you are sleeping and I would recommend using oxygen as previously prescribed.   I am sorry about the difficulty with obtaining oxygen previously but I am happy to help assist with oxygen at home again if you change your mind.  As we discussed if your oxygen is dropping lower that could cause harm in other ways including to heart or brain.  If you have any new or worsening shortness of breath I recommend emergency room evaluation based on where your current oxygen level is today.  Please let me know if there are any questions or if I can help further.  Keep follow-up with pulmonary as planned on October 8. Take care!   Pulmonary Fibrosis  Pulmonary fibrosis is a type of lung disease that causes scarring. Over time, the scar tissue builds up in the air sacs of your lungs (alveoli). This makes it hard for you to breathe because less oxygen gets into your bloodstream. Scarring from pulmonary fibrosis is permanent and may lead to other serious health problems. What are the causes? There are many different causes of pulmonary fibrosis. In some cases, the cause is not known. This is called idiopathic pulmonary fibrosis. Other causes include: Exposure to chemicals and substances found in agricultural, farm, Holiday representative, or factory work. These include mold, asbestos, silica, metal dusts, and toxic fumes. Sarcoidosis. In this disease, areas of inflammatory cells (granulomas) form and most often affect the lungs. Autoimmune diseases. These include diseases such as rheumatoid arthritis, systemic sclerosis, or connective tissue disease. Taking certain medicines. These include drugs used in radiation therapy or used to treat seizures, heart problems, and some infections. What increases  the risk? You are more  likely to develop this condition if: You have a family history of the disease. You are an older person. The condition is more common in older adults. You have a history of smoking. You have a job that exposes you to certain chemicals. You have gastroesophageal reflux disease (GERD). What are the signs or symptoms? Symptoms of this condition include: Difficulty breathing that gets worse with activity. Shortness of breath (dyspnea). Dry, hacking cough. Rapid, shallow breathing during exercise or while at rest. Other symptoms may include: Loss of appetite or weight loss Tiredness (fatigue) or weakness. Bluish skin and lips. Rounded and enlarged fingertips (clubbing). How is this diagnosed? This condition may be diagnosed based on: Your symptoms and medical history. A physical exam. You may also have tests, including: A test that involves looking inside your lungs with an instrument (bronchoscopy). Imaging studies of your lungs and heart. Tests to measure how well you are breathing (pulmonary function tests). Blood tests. Tests to see how well your lungs work while you are walking (pulmonary stress test). A procedure to remove a lung tissue sample to look at it under a microscope (biopsy). How is this treated? There is no cure for pulmonary fibrosis. Treatment focuses on managing symptoms and preventing scarring from getting worse. This may include: Medicines, such as: Steroids to prevent permanent lung changes. Medicines to suppress your body's defense system (immune system). Medicines to help with lung function by reducing inflammation or scarring. Ongoing monitoring with X-rays and lab work. Oxygen therapy. Pulmonary rehabilitation. Surgery. In some cases, a lung transplant is possible. Follow these instructions at home:  Medicines Take over-the-counter and prescription medicines only as told by your health care provider. Keep your vaccinations up to  date as recommended by your health care provider. Activity Get regular exercise, but do not pick activities that are too strenuous for you. Ask your health care provider what activities are safe for you. If you have physical limitations, you may get exercise by walking, using a stationary bike, or doing chair exercises. Ask your health care provider about using oxygen while exercising. Do breathing exercises as told by your health care provider. Plan rest periods when you get tired. General instructions Do not use any products that contain nicotine or tobacco. These products include cigarettes, chewing tobacco, and vaping devices, such as e-cigarettes. If you need help quitting, ask your health care provider. If you are exposed to chemicals and substances at work, make sure that you wear a mask or respirator at all times. Learn to manage stress. If you need help to do this, ask your health care provider. Join a pulmonary rehabilitation program or a support group for people with pulmonary fibrosis. Eat small meals often so you do not get too full. Overeating can make breathing trouble worse. Maintain a healthy weight. Lose weight if you need to. Keep all follow-up visits. This is important. Where to find more information American Lung Association: www.lung.org National Heart, Lung, and Blood Institute: PopSte[employer]is Pulmonary Fibrosis Foundation: pulmonaryfibrosis.org Contact a health care provider if: You have symptoms that do not get better with medicines. You are not able to be as active as usual. You have trouble taking a deep breath. You have a fever or chills. You have blue lips or skin. You have a lot of headaches. You cough up mucus that is dark in color. You have feelings of depression or sadness. You are unable to sleep because it is hard to breathe. Get help right away if: Your symptoms suddenly  worsen. You have chest pain. You cough up blood. You get very confused or  sleepy. These symptoms may be an emergency. Get help right away. Call 911. Do not wait to see if the symptoms will go away. Do not drive yourself to the hospital. Summary Pulmonary fibrosis is a type of lung disease that causes scar tissue to build up in the air sacs of your lungs (alveoli) over time. This makes it hard for you to breathe because less oxygen gets into your bloodstream. Scarring from pulmonary fibrosis is permanent and may lead to other serious health problems. You are more likely to develop this condition if you have a family history of the condition or a job that exposes you to certain chemicals. There is no cure for pulmonary fibrosis. Treatment focuses on managing symptoms and preventing scarring from getting worse. This information is not intended to replace advice given to you by your health care provider. Make sure you discuss any questions you have with your health care provider. Document Revised: 11/14/2020 Document Reviewed: 11/14/2020 Elsevier Patient Education  2024 Elsevier Inc.      Signed,   Meredith Staggers, MD [facility], [facility] 01/06/23 2:42 PM

## 2023-01-06 NOTE — Patient Instructions (Addendum)
Blood pressure is on the lower side here today.  If you have any lightheadedness or dizziness, be seen right away.  I appreciate you coming today and I am happy to hear that you are feeling better.   However your oxygen level was also low on repeat testing today.  Initially it was 89%, and when I rechecked it it was between 88 to 90%.  As we discussed that oxygen may go lower at night when you are sleeping and I would recommend using oxygen as previously prescribed.   I am sorry about the difficulty with obtaining oxygen previously but I am happy to help assist with oxygen at home again if you change your mind.  As we discussed if your oxygen is dropping lower that could cause harm in other ways including to heart or brain.  If you have any new or worsening shortness of breath I recommend emergency room evaluation based on where your current oxygen level is today.  Please let me know if there are any questions or if I can help further.  Keep follow-up with pulmonary as planned on October 8. Take care!   Pulmonary Fibrosis  Pulmonary fibrosis is a type of lung disease that causes scarring. Over time, the scar tissue builds up in the air sacs of your lungs (alveoli). This makes it hard for you to breathe because less oxygen gets into your bloodstream. Scarring from pulmonary fibrosis is permanent and may lead to other serious health problems. What are the causes? There are many different causes of pulmonary fibrosis. In some cases, the cause is not known. This is called idiopathic pulmonary fibrosis. Other causes include: Exposure to chemicals and substances found in agricultural, farm, Holiday representative, or factory work. These include mold, asbestos, silica, metal dusts, and toxic fumes. Sarcoidosis. In this disease, areas of inflammatory cells (granulomas) form and most often affect the lungs. Autoimmune diseases. These include diseases such as rheumatoid arthritis, systemic sclerosis, or connective  tissue disease. Taking certain medicines. These include drugs used in radiation therapy or used to treat seizures, heart problems, and some infections. What increases the risk? You are more likely to develop this condition if: You have a family history of the disease. You are an older person. The condition is more common in older adults. You have a history of smoking. You have a job that exposes you to certain chemicals. You have gastroesophageal reflux disease (GERD). What are the signs or symptoms? Symptoms of this condition include: Difficulty breathing that gets worse with activity. Shortness of breath (dyspnea). Dry, hacking cough. Rapid, shallow breathing during exercise or while at rest. Other symptoms may include: Loss of appetite or weight loss Tiredness (fatigue) or weakness. Bluish skin and lips. Rounded and enlarged fingertips (clubbing). How is this diagnosed? This condition may be diagnosed based on: Your symptoms and medical history. A physical exam. You may also have tests, including: A test that involves looking inside your lungs with an instrument (bronchoscopy). Imaging studies of your lungs and heart. Tests to measure how well you are breathing (pulmonary function tests). Blood tests. Tests to see how well your lungs work while you are walking (pulmonary stress test). A procedure to remove a lung tissue sample to look at it under a microscope (biopsy). How is this treated? There is no cure for pulmonary fibrosis. Treatment focuses on managing symptoms and preventing scarring from getting worse. This may include: Medicines, such as: Steroids to prevent permanent lung changes. Medicines to suppress your body's defense  system (immune system). Medicines to help with lung function by reducing inflammation or scarring. Ongoing monitoring with X-rays and lab work. Oxygen therapy. Pulmonary rehabilitation. Surgery. In some cases, a lung transplant is  possible. Follow these instructions at home:  Medicines Take over-the-counter and prescription medicines only as told by your health care provider. Keep your vaccinations up to date as recommended by your health care provider. Activity Get regular exercise, but do not pick activities that are too strenuous for you. Ask your health care provider what activities are safe for you. If you have physical limitations, you may get exercise by walking, using a stationary bike, or doing chair exercises. Ask your health care provider about using oxygen while exercising. Do breathing exercises as told by your health care provider. Plan rest periods when you get tired. General instructions Do not use any products that contain nicotine or tobacco. These products include cigarettes, chewing tobacco, and vaping devices, such as e-cigarettes. If you need help quitting, ask your health care provider. If you are exposed to chemicals and substances at work, make sure that you wear a mask or respirator at all times. Learn to manage stress. If you need help to do this, ask your health care provider. Join a pulmonary rehabilitation program or a support group for people with pulmonary fibrosis. Eat small meals often so you do not get too full. Overeating can make breathing trouble worse. Maintain a healthy weight. Lose weight if you need to. Keep all follow-up visits. This is important. Where to find more information American Lung Association: www.lung.org National Heart, Lung, and Blood Institute: PopSteam[employer]is Pulmonary Fibrosis Foundation: pulmonaryfibrosis.org Contact a health care provider if: You have symptoms that do not get better with medicines. You are not able to be as active as usual. You have trouble taking a deep breath. You have a fever or chills. You have blue lips or skin. You have a lot of headaches. You cough up mucus that is dark in color. You have feelings of depression or  sadness. You are unable to sleep because it is hard to breathe. Get help right away if: Your symptoms suddenly worsen. You have chest pain. You cough up blood. You get very confused or sleepy. These symptoms may be an emergency. Get help right away. Call 911. Do not wait to see if the symptoms will go away. Do not drive yourself to the hospital. Summary Pulmonary fibrosis is a type of lung disease that causes scar tissue to build up in the air sacs of your lungs (alveoli) over time. This makes it hard for you to breathe because less oxygen gets into your bloodstream. Scarring from pulmonary fibrosis is permanent and may lead to other serious health problems. You are more likely to develop this condition if you have a family history of the condition or a job that exposes you to certain chemicals. There is no cure for pulmonary fibrosis. Treatment focuses on managing symptoms and preventing scarring from getting worse. This information is not intended to replace advice given to you by your health care provider. Make sure you discuss any questions you have with your health care provider. Document Revised: 11/14/2020 Document Reviewed: 11/14/2020 Elsevier Patient Education  2024 [employer].

## 2023-01-07 ENCOUNTER — Telehealth: Payer: Self-pay | Admitting: Family Medicine

## 2023-01-07 ENCOUNTER — Encounter: Payer: Self-pay | Admitting: Family Medicine

## 2023-01-07 NOTE — Telephone Encounter (Signed)
Placed in your sign folder  

## 2023-01-07 NOTE — Telephone Encounter (Signed)
Verification of Chronic Condition Status (VCC) Form  He needs forms completed and would a call to pick up when completed.   Placed in front bin.

## 2023-01-09 NOTE — Telephone Encounter (Signed)
Form completed, ready for pickup. placed in fax bin at back nurse station

## 2023-01-10 NOTE — Telephone Encounter (Signed)
faxed

## 2023-01-14 ENCOUNTER — Ambulatory Visit (HOSPITAL_BASED_OUTPATIENT_CLINIC_OR_DEPARTMENT_OTHER): Payer: Medicare HMO | Admitting: Internal Medicine

## 2023-01-14 ENCOUNTER — Telehealth: Payer: Self-pay | Admitting: Internal Medicine

## 2023-01-14 ENCOUNTER — Other Ambulatory Visit (HOSPITAL_COMMUNITY): Payer: Self-pay

## 2023-01-14 ENCOUNTER — Encounter: Payer: Self-pay | Admitting: Internal Medicine

## 2023-01-14 ENCOUNTER — Ambulatory Visit: Payer: Medicare HMO | Admitting: Internal Medicine

## 2023-01-14 VITALS — BP 104/69 | HR 83 | Ht 70.0 in | Wt 134.2 lb

## 2023-01-14 DIAGNOSIS — J84112 Idiopathic pulmonary fibrosis: Secondary | ICD-10-CM

## 2023-01-14 DIAGNOSIS — R634 Abnormal weight loss: Secondary | ICD-10-CM

## 2023-01-14 DIAGNOSIS — R590 Localized enlarged lymph nodes: Secondary | ICD-10-CM | POA: Diagnosis not present

## 2023-01-14 DIAGNOSIS — R683 Clubbing of fingers: Secondary | ICD-10-CM | POA: Diagnosis not present

## 2023-01-14 DIAGNOSIS — J849 Interstitial pulmonary disease, unspecified: Secondary | ICD-10-CM | POA: Diagnosis not present

## 2023-01-14 DIAGNOSIS — Z5181 Encounter for therapeutic drug level monitoring: Secondary | ICD-10-CM

## 2023-01-14 DIAGNOSIS — R64 Cachexia: Secondary | ICD-10-CM

## 2023-01-14 LAB — PULMONARY FUNCTION TEST
FEF 25-75 Pre: 2.44 L/s
FEF2575-%Pred-Pre: 130 %
FEV1-%Pred-Pre: 31 %
FEV1-Pre: 0.88 L
FEV1FVC-%Pred-Pre: 124 %
FEV6-%Pred-Pre: 26 %
FEV6-Pre: 0.99 L
FEV6FVC-%Pred-Pre: 107 %
FVC-%Pred-Pre: 25 %
FVC-Pre: 0.99 L
Pre FEV1/FVC ratio: 89 %
Pre FEV6/FVC Ratio: 100 %

## 2023-01-14 NOTE — Patient Instructions (Addendum)
ICD-10-CM   1. IPF (idiopathic pulmonary fibrosis) (HCC)  J84.112     2. Encounter for therapeutic drug monitoring  Z51.81     3. Clubbing of fingers  R68.3     4. Mediastinal adenopathy  R59.0     5. Cachexia (HCC)  R64     6. Abnormal weight loss  R63.4        #ILD/IPF    - giving you diagnosis of IPF 01/14/2023 - based on age > 25, male, UIP pattern , prpgression, clubbing and negative sreology - technical issues with PFT cooperation 01/14/2023    PLAN  - complete ILD questionnaire packet with you and bring it back next time  - do overnight oxygen test on room air  -Start pirfenidone low-dose protocol   -1 pill 3 times daily with food for 2 weeks and then go to 2 pills 3 times daily   -No need for oxygen at rest and for simple exertion up a flight of stairs  -Clinical trial participation in the future   #Mediasinal adenopathy  > 2cm -reassuring PET scan in September 2024 and stability over time per radiology  Plan  -Expectant follow-up  #Cachexia and weight loss  -Glad to hear on this visit 01/10/2023 it is better.  Please note pirfenidone can make this worse  Plan - Monitor  Followup  - 6 weeks with Dr Marchelle Gearing or nurse practitioner 30 min vsiit   -Bring ILD packet with you  -We will see how pirfenidone uptake is going

## 2023-01-14 NOTE — Telephone Encounter (Signed)
Patient has [facility] plan which will not cover brand-name Esbriet without trial and failure of generic pirfenidone  Patient also appears to have commercial [employer] plan through   Submitted a Prior Authorization request to [facility] for PIRFENIDONE via CoverMyMeds. Will update once we receive a response.  Key: [id number]   Submitted a Prior Authorization request to [facility] for PIRFENIDONE via CoverMyMeds. Pending clinical questions to populate  Key: [id number]   Chesley Mires, PharmD, MPH, BCPS, CPP Clinical Pharmacist (Rheumatology and Pulmonology)

## 2023-01-14 NOTE — Telephone Encounter (Signed)
This has been handled, there was a duplicate message regarding the same thing.

## 2023-01-14 NOTE — Patient Instructions (Signed)
Attempted Spirometry and DLCO; Patient unable to complete DLCO; Spirometry performed today;

## 2023-01-14 NOTE — Progress Notes (Signed)
Attempted Spirometry and DLCO; Patient unable to complete DLCO; Spirometry performed today;

## 2023-01-14 NOTE — Telephone Encounter (Signed)
Rx team  [name]  = Please note new S.  Start.  Pirfenidone low-dose protocol but 1 pill 3 times daily for 2 weeks and then go up to 2 pills 3 times daily

## 2023-01-14 NOTE — Progress Notes (Signed)
OV 12/03/2022-   Subjective:  Patient ID: Eric Ferrell., male , DOB: [birth year] , age 82 y.o. , MRN: [id number] , ADDRESS: [address] [place] [address] PCP Shade Flood, MD Patient Care Team: Shade Flood, MD as PCP - General (Family Medicine) Nahser, Deloris Ping, MD as PCP - Cardiology (Cardiology)  This Provider for this visit: Treatment Team:  Attending Provider: Kalman Shan, MD    12/03/2022 -   Chief Complaint  Patient presents with   Hospitalization Follow-up    Having SOB, cough sometimes.     HPI Eric Ferrell. 82 y.o. -++ present with his son Eric Ferrell.  The latter is an independent historian.  He is referred for ILD.  He is a transfer of care from Dr. Sandrea Hughs.  Patient lives in [place].  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  He tells me that in November 2023 1 night they sent him out to work in the cold air.  He said he was not properly dressed.  And then he said he almost froze.  By the morning he was quite short of breath and since then he has had shortness of breath.  He does not feel like he is getting worse.  In review of his images even chest x-ray in 2021 shows presence of possible ILD.  Definitely present in the summer 2023 before his cold air exposure.  It is currently worse in March 2024.  I personally visualized this and showed it to him.  According to radiology to his UIP.  I concur.  Dyspnea is present with exertion relieved by rest.    His cough is not a big symptom is only mild and occasional.  The other issue is that he is having weight loss.  Is lost around 15 pounds of weight.  Is pretty cachectic with a BMI of 19.  No HIV test on lab review.  Thyroid function studies are normal in 2024.   Other issue that CT  scan does show mediastinal adenopathy which radiology feels is negative but it is significant in size.   He is a non-smoker trying it out in college.  He denies any mold exposure in his workplace or at home.  He has not done the ILD questionnaire  He has stenosis although he says he does have a heart murmur since childhood.  He says that his mother did not let him play sports because of this.  He is wondering if his illness could be because of childhood issues.   Same and notices pulse ox at 88% on room air at rest.++  - FENO  04/30/2022   =  35 on no RX  - Allergy screen 04/30/2022 >  Eos 0.2 /  IgE  108  Echo 06/13/22  AS  mod to severe   CT Chest data from date: [facility] 07/17/22  - personally visualized and independently interpreted : yes - my findings are: AS below  Narrative & Impression  CLINICAL DATA:  Cough, shortness of breath   EXAM: CT CHEST WITHOUT CONTRAST   TECHNIQUE: Multidetector CT imaging of the chest was performed following the standard protocol without intravenous contrast. High resolution imaging of the lungs, as well as inspiratory and expiratory  imaging, was performed.   RADIATION DOSE REDUCTION: This exam was performed according to the departmental dose-optimization program which includes automated exposure control, adjustment of the mA and/or kV according to patient size and/or use of iterative reconstruction technique.   COMPARISON:  10/03/1998   FINDINGS: Cardiovascular: Aortic atherosclerosis. Dense aortic valve calcifications. Normal heart size. Three-vessel coronary artery calcifications. No pericardial effusion.   Mediastinum/Nodes: Numerous enlarged mediastinal lymph nodes, pretracheal nodes measuring up to 2.0 x 1.5 cm (series 2, image 53). Thyroid gland, trachea, and esophagus demonstrate no significant findings.   Lungs/Pleura: Severe pulmonary fibrosis in a pattern with apical to basal gradient and asymmetrically worse in the right  lung, characterized by irregular peripheral interstitial opacity, septal thickening, traction bronchiectasis, subpleural bronchiolectasis, and large areas of honeycombing throughout, particularly in the right lung base. Fibrotic findings are significantly worsened compared to prior examination dated 10/02/2021. No significant air trapping on expiratory phase imaging. No pleural effusion or pneumothorax.   Upper Abdomen: No acute abnormality.   Musculoskeletal: No chest wall abnormality. No acute osseous findings.   IMPRESSION: 1. Severe pulmonary fibrosis in a pattern with apical to basal gradient and asymmetrically worse in the right lung, characterized by irregular peripheral interstitial opacity, septal thickening, traction bronchiectasis, subpleural bronchiolectasis, and large areas of honeycombing throughout, particularly in the right lung base. Fibrotic findings are significantly worsened compared to prior examination dated 10/02/2021. Findings are consistent with UIP per consensus guidelines: Diagnosis of Idiopathic Pulmonary Fibrosis: An Official ATS/ERS/JRS/ALAT Clinical Practice Guideline. Am Rosezetta Schlatter Crit Care Med Vol 198, Iss 5, [phone], Dec 07 2016. 2. Numerous enlarged mediastinal lymph nodes, unchanged, likely reactive fibrosis. 3. Coronary artery disease. 4. Dense aortic valve calcifications. Correlate for echocardiographic evidence of aortic valve dysfunction.   Aortic Atherosclerosis (ICD10-I70.0).     Electronically Signed   By: Jearld Lesch M.D.   On: 07/19/2022 14:38      ECHO 06/13/22     Sonographer Comments: Technically difficult study due to poor echo  windows. Image acquisition challenging due to patient body habitus.  IMPRESSIONS     1. Left ventricular ejection fraction, by estimation, is 65 to 70%. The  left ventricle has normal function. The left ventricle has no regional  wall motion abnormalities. There is mild left ventricular  hypertrophy.  Left ventricular diastolic parameters  are consistent with Grade I diastolic dysfunction (impaired relaxation).   2. Right ventricular systolic function is normal. The right ventricular  size is normal.   3. The mitral valve is abnormal. Trivial mitral valve regurgitation.   4. The aortic valve is calcified. There is severe calcifcation of the  aortic valve. Aortic valve regurgitation is trivial. Moderate to severe  aortic valve stenosis. Aortic regurgitation PHT measures 616 msec. Aortic  valve area, by VTI measures 0.71 cm.   Aortic valve mean gradient measures 27.0 mmHg. Aortic valve Vmax measures  3.39 m/s. Peak gradient 46 mmHg. DI is 0.31.   Comparison(s): No prior Echocardiogram.  OV 01/14/2023  Subjective:  Patient ID: Eric Ferrell., male , DOB: [birth year] , age 33 y.o. , MRN: [id number] , ADDRESS: [address] [place] [address] PCP Shade Flood, MD Patient Care Team: Shade Flood, MD as PCP - General (Family Medicine) Nahser, Deloris Ping, MD as PCP - Cardiology (Cardiology)  This Provider for this visit: Treatment Team:  Attending Provider: Kalman Shan, MD    01/14/2023 -   Chief Complaint  Patient presents with   Follow-up    Pft  f/u, pt did have a hard time.    #ILD workup in progress #Mediastinal adenopathy #Severe arctic calcification with moderate aortic valve stenosis -March 2024. #Cachexia.  HPI Eric Ferrell. 82 y.o. -returns for follow-up.  #Interstitial lung disease: I personally visualized the films and showed it to him and his son.  There is progression of this honeycombing.  There is asymmetric feature to this but it is UIP.  There is progression between 2023 and 2024.  Serology hypersensitive pneumonitis panel BNP are all normal.  ILD packet not done.  Given a diagnosis of idiopathic pulmonary fibrosis [IPF].  Discussed that given progression in IPF diagnosis treatment is indicated.  Went over pirfenidone and also  went over nintedanib.  Went over that because of his weight loss and cachexia and his age side effects are to be amplified.  We took a shared decision making to commit to treatment but will do low-dose protocol.  He preferred pirfenidone after listening to the side effect profile of both drugs.  Will do a low-dose protocol with a slow escalation.  His liver function test has been consistently normal this year including July 2024.   Esbriet/Pirfenidone requires intensive drug monitoring due to high concerns for Adverse effects of , including  Drug Induced Liver Injury, significant GI side effects that include but not limited to Diarrhea, Nausea, Vomiting,  and other system side effects that include Fatigue, headaches, weight loss and other side effects such as skin rash. These will be monitored with  blood work such as LFT initially once a month for 6 months and then quarterly.  Sent message to  pharmacy team.  Of note he did do pulmonary function test but it was very poor quality because he had technical challenges cooperating..  We did do a sit/stand excess hypoxemia test and he did not desaturate.  I did advise him no need for exertional oxygen.  And nighttime oxygen test has not been done yet.  Will reorder this.  His son is here with him today.  He did feel like the patient and offered for the co-pay.  Discussed trials as a care option in the future and they are interested.  #Cachexia and weight loss he says this is improved  #Medicine adenopathy he did have a PET scan.  It is a low uptake and it is stable.  PET SCAN 12/19/22   IMPRESSION: 1. Mediastinal adenopathy is mildly hypermetabolic and unchanged from 10/02/2021. Findings strongly favor a reactive etiology in the setting of severe interstitial lung disease. 2. Interstitial lung disease, better characterized on 07/17/2022 CT chest. 3. Small right renal stone. 4. Aortic atherosclerosis (ICD10-I70.0). Dense aortic  valvular calcification.     Electronically Signed   By: Leanna Battles M.D.   On: 01/03/2023 13:09     PFT    Latest Ref Rng & Units 01/14/2023    8:20 AM  PFT Results  FVC-Pre L 0.99  P  FVC-Predicted Pre % 25  P  Pre FEV1/FVC % % 89  P  FEV1-Pre L 0.88  P  FEV1-Predicted Pre % 31  P    P Preliminary result   Simple office walk 224 (66+46 x 2) feet Pod A at [facility] x  3 laps goal with forehead probe 01/14/2023    O2 used ra   Number laps completed 15 sit stand   Comments about pace fast   Resting Pulse Ox/HR 100% and 106/min   Final Pulse Ox/HR 97% and 110/min  Desaturated </= 88% no   Desaturated <= 3% points no   Got Tachycardic >/= 90/min yes   Symptoms at end of test Dyspnea +   Miscellaneous comments x      Latest Reference Range & Units 04/30/22 11:21 12/03/22 11:34  IgE (Immunoglobulin E), Serum <OR=114 kU/L 108   ANA Titer 1   Negative  Cyclic Citrullin Peptide Ab UNITS  <16  dsDNA Ab 0 - 9 IU/mL  2  ENA RNP Ab 0.0 - 0.9 AI  0.9  ENA SSA (RO) Ab 0.0 - 0.9 AI  <0.2  ENA SSB (LA) Ab 0.0 - 0.9 AI  <0.2  RA Latex Turbid. <14 IU/mL  38 (H)  ENA SM Ab Ser-aCnc 0.0 - 0.9 AI  <0.2  Scleroderma (Scl-70) (ENA) Antibody, IgG 0.0 - 0.9 AI  <0.2  (H): Data is abnormally high   LAB RESULTS last 96 hours No results found.  LAB RESULTS last 90 days Recent Results (from the past 2160 hour(s))  Comprehensive metabolic panel     Status: Abnormal   Collection Time: 10/23/22  2:32 PM  Result Value Ref Range   Sodium 134 (L) 135 - 145 mEq/L   Potassium 4.9 3.5 - 5.1 mEq/L   Chloride 98 96 - 112 mEq/L   CO2 33 (H) 19 - 32 mEq/L   Glucose, Bld 129 (H) 70 - 99 mg/dL   BUN 17 6 - 23 mg/dL   Creatinine, Ser 5.36 0.40 - 1.50 mg/dL   Total Bilirubin 0.3 0.2 - 1.2 mg/dL   Alkaline Phosphatase 64 39 - 117 U/L   AST 24 0 - 37 U/L   ALT 11 0 - 53 U/L   Total Protein 8.9 (H) 6.0 - 8.3 g/dL   Albumin 3.8 3.5 - 5.2 g/dL   GFR 64.40 >34.74 mL/min    Comment: Calculated  using the CKD-EPI Creatinine Equation (2021)   Calcium 9.9 8.4 - 10.5 mg/dL  Lipid panel     Status: None   Collection Time: 10/23/22  2:32 PM  Result Value Ref Range   Cholesterol 155 0 - 200 mg/dL    Comment: ATP Ferrell Classification       Desirable:  < 200 mg/dL               Borderline High:  200 - 239 mg/dL          High:  > = 259 mg/dL   Triglycerides 56.3 0.0 - 149.0 mg/dL    Comment: Normal:  <875 mg/dLBorderline High:  150 - 199 mg/dL   HDL 64.33 >29.51 mg/dL   VLDL 88.4 0.0 - 16.6 mg/dL   LDL Cholesterol 93 0 - 99 mg/dL   Total CHOL/HDL Ratio 3     Comment:                Men          Women1/2 Average Risk     3.4          3.3Average Risk          5.0          4.42X Average Risk          9.6          7.13X Average Risk          15.0          11.0  NonHDL 110.66     Comment: NOTE:  Non-HDL goal should be 30 mg/dL higher than patient's LDL goal (i.e. LDL goal of < 70 mg/dL, would have non-HDL goal of < 100 mg/dL)  Hemoglobin Z6X     Status: None   Collection Time: 10/23/22  2:32 PM  Result Value Ref Range   Hgb A1c MFr Bld 6.2 4.6 - 6.5 %    Comment: Glycemic Control Guidelines for People with Diabetes:Non Diabetic:  <6%Goal of Therapy: <7%Additional Action Suggested:  >8%   HIV antibody (with reflex)     Status: None   Collection Time: 12/03/22 11:34 AM  Result Value Ref Range   HIV 1&2 Ab, 4th Generation NON-REACTIVE NON-REACTIVE    Comment: HIV-1 antigen and HIV-1/HIV-2 antibodies were not detected. There is no laboratory evidence of HIV infection. Marland Kitchen PLEASE NOTE: This information has been disclosed to you from records whose confidentiality may be protected by state law.  If your state requires such protection, then the state law prohibits you from making any further disclosure of the information without the specific written consent of the person to whom it pertains, or as otherwise permitted by law. A general authorization for the release of medical  or other information is NOT sufficient for this purpose. . For additional information please refer to http://education.[facility].com/faq/FAQ106 (This link is being provided for informational/ educational purposes only.) . Marland Kitchen The performance of this assay has not been clinically validated in patients less than 76 years old. .   B Nat Peptide     Status: None   Collection Time: 12/03/22 11:34 AM  Result Value Ref Range   Pro B Natriuretic peptide (BNP) 36.0 0.0 - 100.0 pg/mL  Hypersensitivity Pneumonitis     Status: None   Collection Time: 12/03/22 11:34 AM  Result Value Ref Range   A.Fumigatus #1 Abs Negative Negative   Micropolyspora faeni, IgG Negative Negative   Thermoactinomyces vulgaris, IgG Negative Negative   A. Pullulans Abs Negative Negative   Thermoact. Saccharii Negative Negative   Pigeon Serum Abs Negative Negative  Aldolase     Status: None   Collection Time: 12/03/22 11:34 AM  Result Value Ref Range   Aldolase 2.5 < OR = 8.1 U/L  CK Total (and CKMB)     Status: Abnormal   Collection Time: 12/03/22 11:34 AM  Result Value Ref Range   Total CK 26 (L) 44 - 196 U/L   CK, MB <0.7 0 - 5.0 ng/mL   Relative Index  0 - 4.0    Comment: Result not calculated because one or more required values exceed analytical limits.   Cyclic citrul peptide antibody, IgG     Status: None   Collection Time: 12/03/22 11:34 AM  Result Value Ref Range   Cyclic Citrullin Peptide Ab <09 UNITS    Comment: Reference Range Negative:            <20 Weak Positive:       20-39 Moderate Positive:   40-59 Strong Positive:     >59 .   Rheumatoid Factor     Status: Abnormal   Collection Time: 12/03/22 11:34 AM  Result Value Ref Range   Rheumatoid fact SerPl-aCnc 38 (H) <14 IU/mL  ANA+ENA+DNA/DS+Scl 70+SjoSSA/B     Status: None   Collection Time: 12/03/22 11:34 AM  Result Value Ref Range   ANA Titer 1 Negative     Comment:  Negative   <1:80                                       Borderline  1:80                                      Positive   >1:80 ICAP nomenclature: AC-0 For more information about Hep-2 cell patterns use ANApatterns.org, the official website for the International Consensus on Antinuclear Antibody (ANA) Patterns (ICAP).    dsDNA Ab 2 0 - 9 IU/mL    Comment:                                    Negative      <5                                    Equivocal  5 - 9                                    Positive      >9    ENA RNP Ab 0.9 0.0 - 0.9 AI   ENA SM Ab Ser-aCnc <0.2 0.0 - 0.9 AI   Scleroderma (Scl-70) (ENA) Antibody, IgG <0.2 0.0 - 0.9 AI   ENA SSA (RO) Ab <0.2 0.0 - 0.9 AI   ENA SSB (LA) Ab <0.2 0.0 - 0.9 AI  Sed Rate (ESR)     Status: Abnormal   Collection Time: 12/03/22 11:34 AM  Result Value Ref Range   Sed Rate 107 (H) 0 - 20 mm/hr  Glucose, capillary     Status: None   Collection Time: 12/19/22  9:31 AM  Result Value Ref Range   Glucose-Capillary 95 70 - 99 mg/dL    Comment: Glucose reference range applies only to samples taken after fasting for at least 8 hours.  Pulmonary function test     Status: None (Preliminary result)   Collection Time: 01/14/23  8:20 AM  Result Value Ref Range   FVC-Pre 0.99 L   FVC-%Pred-Pre 25 %   FEV1-Pre 0.88 L   FEV1-%Pred-Pre 31 %   FEV6-Pre 0.99 L   FEV6-%Pred-Pre 26 %   Pre FEV1/FVC ratio 89 %   FEV1FVC-%Pred-Pre 124 %   Pre FEV6/FVC Ratio 100 %   FEV6FVC-%Pred-Pre 107 %   FEF 25-75 Pre 2.44 L/sec   FEF2575-%Pred-Pre 130 %         has a past medical history of Heart murmur and Nervous stomach.   reports that he quit smoking about 52 years ago. His smoking use included cigarettes. He started smoking about 57 years ago. He has a 2.5 pack-year smoking history. He has never used smokeless tobacco.  Past Surgical History:  Procedure Laterality Date   COLONOSCOPY     CYSTECTOMY     left leg   POLYPECTOMY      No Known Allergies  Immunization History   Administered Date(s) Administered   Fluad Quad(high Dose 65+) 04/22/2019, 01/01/2021, 02/20/2022   Influenza, High Dose Seasonal PF 04/15/2018, 12/27/2022   Influenza,inj,Quad PF,6+ Mos 03/13/2015, 01/11/2016, 03/21/2017  Influenza-Unspecified 04/08/2013   PFIZER(Purple Top)SARS-COV-2 Vaccination 05/23/2019, 06/15/2019, 01/22/2020, 01/03/2021   PNEUMOCOCCAL CONJUGATE-20 04/10/2022   Pfizer(Comirnaty)Fall Seasonal Vaccine 12 years and older 12/27/2022   Pneumococcal Conjugate-13 03/13/2015   Pneumococcal Polysaccharide-23 04/11/2013   Rsv, Bivalent, Protein Subunit Rsvpref,pf Verdis Frederickson) 12/27/2022   Tdap 03/13/2015   Zoster Recombinant(Shingrix) 10/15/2017, 04/09/2018   Zoster, Live 04/08/2014    Family History  Problem Relation Age of Onset   Stroke Mother    Diabetes Mother    Healthy Father    Diabetes Sister    Healthy Brother    Healthy Maternal Grandmother    Healthy Maternal Grandfather    Healthy Paternal Grandmother    Healthy Paternal Grandfather    Healthy Brother    Healthy Sister    Colon cancer Neg Hx    Esophageal cancer Neg Hx    Prostate cancer Neg Hx    Rectal cancer Neg Hx    Stomach cancer Neg Hx      Current Outpatient Medications:    aspirin 81 MG tablet, Take 162 mg by mouth daily. , Disp: , Rfl:    Multiple Vitamins-Minerals (MULTIVITAMIN WITH MINERALS) tablet, Take 1 tablet by mouth daily., Disp: , Rfl:       Objective:   Vitals:   01/14/23 0948  BP: 104/69  Pulse: 83  SpO2: 99%  Weight: 134 lb 3.2 oz (60.9 kg)  Height: 5\' 10"  (1.778 m)    Estimated body mass index is 19.26 kg/m as calculated from the following:   Height as of this encounter: 5\' 10"  (1.778 m).   Weight as of this encounter: 134 lb 3.2 oz (60.9 kg).  @WEIGHTCHANGE @  [employer]   01/14/23 0948  Weight: 134 lb 3.2 oz (60.9 kg)     Physical Exam   General: No distress. Looks well O2 at rest: no Cane present: no Sitting in wheel chair: no Frail:  YES Obese: no Neuro: Alert and Oriented x 3. GCS 15. Speech normal Psych: Pleasant        Assessment:       ICD-10-CM   1. IPF (idiopathic pulmonary fibrosis) (HCC)  J84.112     2. Encounter for therapeutic drug monitoring  Z51.81     3. Clubbing of fingers  R68.3     4. Mediastinal adenopathy  R59.0     5. Cachexia (HCC)  R64     6. Abnormal weight loss  R63.4          Plan:     Patient Instructions     ICD-10-CM   1. IPF (idiopathic pulmonary fibrosis) (HCC)  J84.112     2. Encounter for therapeutic drug monitoring  Z51.81     3. Clubbing of fingers  R68.3     4. Mediastinal adenopathy  R59.0     5. Cachexia (HCC)  R64     6. Abnormal weight loss  R63.4        #ILD/IPF    - giving you diagnosis of IPF 01/14/2023 - based on age > 66, male, UIP pattern , prpgression, clubbing and negative sreology - technical issues with PFT cooperation 01/14/2023    PLAN  - complete ILD questionnaire packet with you and bring it back next time  - do overnight oxygen test on room air  -Start pirfenidone low-dose protocol   -1 pill 3 times daily with food for 2 weeks and then go to 2 pills 3 times daily   -No need for oxygen at rest and  for simple exertion up a flight of stairs  -Clinical trial participation in the future   #Mediasinal adenopathy  > 2cm -reassuring PET scan in September 2024 and stability over time per radiology  Plan  -Expectant follow-up  #Cachexia and weight loss  -Glad to hear on this visit 01/10/2023 it is better.  Please note pirfenidone can make this worse  Plan - Monitor  Followup  - 6 weeks with Dr Marchelle Gearing or nurse practitioner 30 min vsiit   -Bring ILD packet with you  -We will see how pirfenidone uptake is going   FOLLOWUP Return in about 6 weeks (around 02/25/2023) for 30 min visit, ILD, with Dr Marchelle Gearing, with any of the APPS.    SIGNATURE    Dr. Kalman Shan, M.D., F.C.C.P,  Pulmonary and Critical Care  Medicine Staff Physician, [facility] Director - Interstitial Lung Disease  Program  Pulmonary Fibrosis [facility] [place], [place], [address]  Pager: [phone], If no answer or between  15:00h - 7:00h: call [phone] Telephone: [phone]  10:20 AM 01/14/2023   HIGh Complexity  OFFICE   2021 E/M guidelines, first released in 2021, with minor revisions added in 2023. Must meet the requirements for 2 out of 3 dimensions to qualify.    Number and complexity of problems addressed Amount and/or complexity of data reviewed Risk of complications and/or morbidity  Severe exacerbation of chronic illness  Acute or chronic illnesses that may pose a threat to life or bodily function, e.g., multiple trauma, acute MI, pulmonary embolus, severe respiratory distress, progressive rheumatoid arthritis, psychiatric illness with potential threat to self or others, peritonitis, acute renal failure, abrupt change in neurological status Must meet the requirements for 2 of 3 of the categories)  Category 1: Tests and documents, historian  Any combination of 3 of the following:  Assessment requiring an independent historian  Review of prior external note(s) from each unique source  Review of results of each unique test  Ordering of each unique test    Category 2: Interpretation of tests    Independent interpretation of a test performed by another physician/other qualified health care professional (not separately reported)  Category 3: Discuss management/tests  Discussion of management or test interpretation with external physician/other qualified health care professional/appropriate source (not separately reported) - PHARMACY TEAM  HIGH risk of morbidity from additional diagnostic testing or treatment Examples only:  Drug therapy requiring intensive monitoring for toxicity  Decision for elective major surgery with identified  pateint or procedure risk factors  Decision regarding hospitalization or escalation of level of care  Decision for DNR or to de-escalate care   Parenteral controlled  substances

## 2023-01-15 ENCOUNTER — Other Ambulatory Visit (HOSPITAL_COMMUNITY): Payer: Self-pay

## 2023-01-15 NOTE — Telephone Encounter (Signed)
Clinical questions for pirfenidone PA completed on CMM  Chesley Mires, PharmD, MPH, BCPS, CPP Clinical Pharmacist (Rheumatology and Pulmonology)

## 2023-01-17 ENCOUNTER — Other Ambulatory Visit (HOSPITAL_COMMUNITY): Payer: Self-pay

## 2023-01-17 NOTE — Telephone Encounter (Signed)
Received notification from [facility] regarding a prior authorization for PIRFENIDONE. Authorization has been APPROVED from 01/14/2023 to 04/07/2024. Approval letter sent to scan center.  Per test claim, copay for 30 days supply is $296.59  Authorization # [id number]   Received notification from [facility] regarding a prior authorization for PIRFENIDONE. Authorization has been APPROVED from 01/15/2023 to 01/15/2024. Approval letter sent to scan center.  Patient must fill through [facility] (pulmonary fibrosis team): [phone]  Authorization # [id number]

## 2023-01-20 ENCOUNTER — Telehealth: Payer: Self-pay | Admitting: Cardiovascular Disease

## 2023-01-20 ENCOUNTER — Telehealth: Payer: Self-pay | Admitting: Internal Medicine

## 2023-01-20 DIAGNOSIS — J84112 Idiopathic pulmonary fibrosis: Secondary | ICD-10-CM

## 2023-01-20 NOTE — Telephone Encounter (Signed)
Patient came in to drop off a Verification of Chronic Condition Form from [facility]. Per the letter it needs to be signed and faxed back in.

## 2023-01-20 NOTE — Telephone Encounter (Signed)
PT dropped off insurance forms to be filled out for his current insurance plan. Please complete and fax to highlighted number on form. The form is in Dr. Harvie Bridge box

## 2023-01-23 MED ORDER — PIRFENIDONE 267 MG PO TABS
ORAL_TABLET | ORAL | 0 refills | Status: DC
Start: 2023-01-23 — End: 2023-01-24

## 2023-01-23 NOTE — Telephone Encounter (Signed)
PT presents to the front with two forms showing he needs a RX sent in for his Pirfenidone because [employer] has auth a discount. I will put these in Dr. Jane Canary box. I will also scan them into his documents.

## 2023-01-23 NOTE — Telephone Encounter (Signed)
He wants the RX to go to [place] on the [address] and [address]

## 2023-01-23 NOTE — Telephone Encounter (Addendum)
Rx for pirfenidone sent to [facility].  Spoke with patient and provided him with pharmacy phone number [phone])  Patient states his insurance plans are not coordinated. I advised him to ask pharmacy to fill through [employer] plan to see if any cheaper.  If not affordable through either plan, patient has my direct number to call back.  Chesley Mires, PharmD, MPH, BCPS, CPP Clinical Pharmacist (Rheumatology and Pulmonology)

## 2023-01-24 MED ORDER — PIRFENIDONE 267 MG PO TABS
ORAL_TABLET | ORAL | 0 refills | Status: DC
Start: 2023-01-24 — End: 2023-10-01

## 2023-01-24 MED ORDER — PIRFENIDONE 267 MG PO TABS: ORAL_TABLET | ORAL | 0 refills | Status: DC

## 2023-01-24 NOTE — Telephone Encounter (Signed)
This medication must be filled with [facility].  It is a Administrator, [employer] and [facility] cannot fill  Chesley Mires, PharmD, MPH, BCPS, CPP Clinical Pharmacist (Rheumatology and Pulmonology)

## 2023-01-24 NOTE — Addendum Note (Signed)
Addended by: Murrell Redden on: 01/24/2023 02:34 PM   Modules accepted: Orders

## 2023-01-24 NOTE — Telephone Encounter (Signed)
[facility] Cancelled there and sent to [place]

## 2023-01-28 NOTE — Telephone Encounter (Signed)
Chronic condition form from [facility] received, filled out and signed by MD. Jeanene Erb and left detailed message for patient stating that form cannot be faxed back in for him as he hasn't signed the patient signature line yet. Asked that he come to office to sign and we can fax back for him at that same time and he can take originals with him. Placing form in envelope with last OV note and placing up front for pt to pick up.

## 2023-01-31 NOTE — Telephone Encounter (Signed)
Per [facility] portal, patient's rx is still being processed by pharmacy  Chesley Mires, PharmD, MPH, BCPS, CPP Clinical Pharmacist (Rheumatology and Pulmonology)

## 2023-02-13 ENCOUNTER — Telehealth: Payer: Self-pay | Admitting: Family Medicine

## 2023-02-13 NOTE — Telephone Encounter (Signed)
Placed in folder at nurse station

## 2023-02-13 NOTE — Telephone Encounter (Signed)
Home Health Certification or Plan of Care Tracking  Plan of Care?  HH Agency:[employer]  Order Number:    Has charge sheet been attached? Yes   Where has form been placed:   [facility]

## 2023-02-14 NOTE — Telephone Encounter (Signed)
Paperwork completed and placed in fax bin at back nurse station

## 2023-02-27 ENCOUNTER — Encounter: Payer: Self-pay | Admitting: Internal Medicine

## 2023-02-27 ENCOUNTER — Ambulatory Visit: Payer: Medicare HMO | Admitting: Internal Medicine

## 2023-02-27 VITALS — BP 130/81 | HR 77 | Ht 70.0 in | Wt 130.6 lb

## 2023-02-27 DIAGNOSIS — Z5181 Encounter for therapeutic drug level monitoring: Secondary | ICD-10-CM | POA: Diagnosis not present

## 2023-02-27 DIAGNOSIS — J84112 Idiopathic pulmonary fibrosis: Secondary | ICD-10-CM

## 2023-02-27 DIAGNOSIS — R64 Cachexia: Secondary | ICD-10-CM

## 2023-02-27 DIAGNOSIS — R590 Localized enlarged lymph nodes: Secondary | ICD-10-CM | POA: Diagnosis not present

## 2023-02-27 DIAGNOSIS — Z789 Other specified health status: Secondary | ICD-10-CM

## 2023-02-27 NOTE — Telephone Encounter (Signed)
Per rep, they've tried to reach patient but he has not picked up their calls. I tried to conference call patient but phone went straight to VM and VM box is full. Son is in room today and he states he will call pharmacy with patient today. Provided them written phone number for pharmacy as well as my callback number.  Provided copay card to pharmacy today for [facility] manufacturer. This brought copay to $0.    Chesley Mires, PharmD, MPH, BCPS, CPP Clinical Pharmacist (Rheumatology and Pulmonology)

## 2023-02-27 NOTE — Progress Notes (Signed)
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 Discuss management/tests  Discussion of management or test interpretation with external physician/other qualified health care professional/appropriate source (not separately reported) - Pharmacy team  HIGH risk of morbidity from additional diagnostic testing or treatment Examples only:  Drug therapy requiring intensive monitoring for toxicity  Decision for elective major surgery with identified pateint or procedure risk factors  Decision regarding hospitalization or escalation of level of care  Decision for DNR or to de-escalate care   Parenteral controlled  substances

## 2023-02-27 NOTE — Patient Instructions (Addendum)
ICD-10-CM   1. IPF (idiopathic pulmonary fibrosis) (HCC)  J84.112     2. Barrier to communication  Z78.9     3. Mediastinal adenopathy  R59.0     4. Cachexia (HCC)  R64         #ILD/IPF    - giving you diagnosis of IPF 01/14/2023 - based on age > 20, male, UIP pattern , prpgression, clubbing and negative sreology - technical issues with PFT cooperation 01/14/2023 -Pirfenidone not started because our investigations revealed that you have not picked up the phone when the pharmacy called you and the voicemail box is full   PLAN - Start pirfenidone low-dose protocol  -1 pill 3 times daily with food for 2 weeks and then go to 2 pills 3 times daily  -Your son will be the primary contact monitoring and helping you take your medications  -Please pick up the phone when pharmacy team and other healthcare providers tried to call you - chek LFOT in 3 months    #Mediasinal adenopathy  > 2cm -reassuring PET scan in September 2024 and stability over time per radiology  Plan  -Expectant follow-up  #Cachexia and weight loss  -Glad to hear on this visit 01/10/2023 it is better.  Please note pirfenidone can make this worse  Plan - Monitor  Followup  - 6 weeks with \\r  nurse practitioner 30 min vsiit   -We will see how pirfenidone uptake is going

## 2023-04-07 ENCOUNTER — Ambulatory Visit (INDEPENDENT_AMBULATORY_CARE_PROVIDER_SITE_OTHER): Payer: Medicare Other | Admitting: Family Medicine

## 2023-04-07 ENCOUNTER — Encounter: Payer: Self-pay | Admitting: Family Medicine

## 2023-04-07 VITALS — BP 118/60 | HR 85 | Temp 97.5°F | Ht 70.0 in | Wt 124.4 lb

## 2023-04-07 DIAGNOSIS — R7303 Prediabetes: Secondary | ICD-10-CM

## 2023-04-07 DIAGNOSIS — I350 Nonrheumatic aortic (valve) stenosis: Secondary | ICD-10-CM | POA: Diagnosis not present

## 2023-04-07 DIAGNOSIS — J8410 Pulmonary fibrosis, unspecified: Secondary | ICD-10-CM | POA: Diagnosis not present

## 2023-04-07 DIAGNOSIS — R634 Abnormal weight loss: Secondary | ICD-10-CM | POA: Diagnosis not present

## 2023-04-07 NOTE — Patient Instructions (Addendum)
Call [facility] to check into the medication prescribed from pulmonary. No new meds today. Make sure to eat regular meals, with some protein as well.  There are various forms of plant protein that can be helpful to incorporate into your meals.  Meal supplement such as Boost or Ensure is also okay to use if you are not that hungry.  If any concerns on labs I will let you know.  Keep follow-up with pulmonologist as planned, as well as your cardiologist.  I will see you in 6 months but please let me know if there are any questions or concerns in the meantime and have a Happy New Year!   [facility] [place] - [address] [place], [place] [address] Phone: [phone]

## 2023-04-07 NOTE — Progress Notes (Signed)
Subjective:  Patient ID: Eric Rowan., male    DOB: [birth year]  Age: 82 y.o. MRN: [id number]  CC:  Chief Complaint  Patient presents with   Medical Management of Chronic Issues    Pt is doing well no concerns today     HPI Eric Ferrell. presents for   Pulmonary fibrosis: See previous visits, under the care of Dr. Marchelle Gearing with pulmonary.  Had been prescribed pirfenidone in October, November visit had not yet started meds. Simple walk test in office without significant desaturation, no current oxygen.    Some concern regarding communication with his phone, son is now involved with assisting on communication. Son present with patient today.  They did try to call [facility] and have not yet heard back, but that was last month.  Plan to repeat call, phone number provided today. Weight has decreased over the past few months.  He is eating approximately 1 meal per day, vegetarian.  Does incorporate some protein.  Drinking plenty of fluids.  Not that hungry for other times. Denies new dyspnea chest pain or new symptoms.  Aortic stenosis Recently seen by cardiology, plan for repeat echo at 26-month interval.  Option of TAVR depending on status of his pulmonary fibrosis if needed.  Prediabetes No current meds.  Vegetarian diet as above.  Some weight loss since prior visit as above.  Doing well, no concerns today. Wt Readings from Last 3 Encounters:  04/07/23 124 lb 6.4 oz (56.4 kg)  02/27/23 130 lb 9.6 oz (59.2 kg)  01/14/23 134 lb 3.2 oz (60.9 kg)     History Patient Active Problem List   Diagnosis Date Noted   Pulmonary fibrosis (HCC) 05/13/2022   Aortic valve stenosis 05/13/2022   DOE (dyspnea on exertion) 04/30/2022   Chronic cough 04/30/2022   Past Medical History:  Diagnosis Date   Heart murmur    Nervous stomach    Past Surgical History:  Procedure Laterality Date   COLONOSCOPY     CYSTECTOMY     left leg   POLYPECTOMY     No Known Allergies Prior  to Admission medications   Medication Sig Start Date End Date Taking? Authorizing Provider  aspirin 81 MG tablet Take 162 mg by mouth daily.    Yes [provider]  Multiple Vitamins-Minerals (MULTIVITAMIN WITH MINERALS) tablet Take 1 tablet by mouth daily.   Yes [provider]  Pirfenidone 267 MG TABS Take 1 tab three times daily for 7 days, then 2 tabs three times daily thereafter **low dose as maintenance 01/24/23  Yes Kalman Shan, MD   Social History   Socioeconomic History   Marital status: Married    Spouse name: Not on file   Number of children: Not on file   Years of education: Not on file   Highest education level: Not on file  Occupational History   Occupation: Nurse, children's  Tobacco Use   Smoking status: Former    Current packs/day: 0.00    Average packs/day: 0.5 packs/day for 5.0 years (2.5 ttl pk-yrs)    Types: Cigarettes    Start date: 44    Quit date: 78    Years since quitting: 53.0   Smokeless tobacco: Never   Tobacco comments:    in college  Vaping Use   Vaping status: Never Used  Substance and Sexual Activity   Alcohol use: No    Alcohol/week: 0.0 standard drinks of alcohol   Drug use: No   Sexual  activity: Yes  Other Topics Concern   Not on file  Social History Narrative   Married.   Education: [employer]   Exercise: Yes   Social Drivers of Corporate investment banker Strain: Low Risk  (12/18/2022)   Overall Financial Resource Strain (CARDIA)    Difficulty of Paying Living Expenses: Not hard at all  Food Insecurity: No Food Insecurity (12/18/2022)   Hunger Vital Sign    Worried About Running Out of Food in the Last Year: Never true    Ran Out of Food in the Last Year: Never true  Transportation Needs: No Transportation Needs (12/18/2022)   PRAPARE - Administrator, Civil Service (Medical): No    Lack of Transportation (Non-Medical): No  Physical Activity: Sufficiently Active (12/18/2022)   Exercise  Vital Sign    Days of Exercise per Week: 3 days    Minutes of Exercise per Session: 150+ min  Stress: No Stress Concern Present (12/18/2022)   [employer] of Occupational Health - Occupational Stress Questionnaire    Feeling of Stress : Not at all  Social Connections: Socially Integrated (12/18/2022)   Social Connection and Isolation Panel [NHANES]    Frequency of Communication with Friends and Family: More than three times a week    Frequency of Social Gatherings with Friends and Family: More than three times a week    Attends Religious Services: More than 4 times per year    Active Member of [employer] or Organizations: Yes    Attends Banker Meetings: More than 4 times per year    Marital Status: Married  Catering manager Violence: Not At Risk (12/18/2022)   Humiliation, Afraid, Rape, and Kick questionnaire    Fear of Current or Ex-Partner: No    Emotionally Abused: No    Physically Abused: No    Sexually Abused: No    Review of Systems   Objective:   Vitals:   04/07/23 1339  BP: 118/60  Pulse: 85  Temp: (!) 97.5 F (36.4 C)  TempSrc: Temporal  SpO2: 94%  Weight: 124 lb 6.4 oz (56.4 kg)  Height: 5\' 10"  (1.778 m)     Physical Exam Vitals reviewed.  Constitutional:      General: He is not in acute distress.    Appearance: He is well-developed. He is not ill-appearing, toxic-appearing or diaphoretic.  HENT:     Head: Normocephalic and atraumatic.  Neck:     Vascular: No carotid bruit or JVD.  Cardiovascular:     Rate and Rhythm: Normal rate and regular rhythm.     Heart sounds: Murmur (3/6 systolic murmur.) heard.  Pulmonary:     Effort: Pulmonary effort is normal.     Comments: Coarse breath sounds, scattered lower lobes bilaterally.  Normal effort, no distress.  Speaking in full sentences. Musculoskeletal:     Right lower leg: No edema.     Left lower leg: No edema.  Skin:    General: Skin is warm and dry.  Neurological:     Mental Status: He  is alert and oriented to person, place, and time.  Psychiatric:        Mood and Affect: Mood normal.        Behavior: Behavior normal.        Assessment & Plan:  Eric Balicki. is a 82 y.o. male . Pulmonary fibrosis (HCC)  Aortic valve stenosis, etiology of cardiac valve disease unspecified  Weight loss  Prediabetes - Plan: Hemoglobin A1c,  Comprehensive metabolic panel  Pulmonary fibrosis as above, followed by pulmonary.  Plan for start of pirfenidone but that has still been difficult to obtain.  Phone number provided for patient's son and he plans to call and check status of that prescription.  Some of his weight loss may be due to his underlying pulmonary fibrosis but we also discussed increasing caloric intake with plant protein, meal supplements if needed.  Check A1c, CMP.  Previous albumin looked okay.  Recheck in 6 months, keep follow-up with pulmonary and cardiac specialist in the interim.  RTC precautions given.  No orders of the defined types were placed in this encounter.  Patient Instructions  [facility] medication prescribed from pulmonary. No new meds today. Make sure to eat regular meals, with some protein as well.  There are various forms of plant protein that can be helpful to incorporate into your meals.  Meal supplement such as Boost or Ensure is also okay to use if you are not that hungry.  If any concerns on labs I will let you know.  Keep follow-up with pulmonologist as planned, as well as your cardiologist.  I will see you in 6 months but please let me know if there are any questions or concerns in the meantime and have a Happy New Year!   [facility] [place] - [address] [place], [place] [address] Phone: [phone]     Signed,   Meredith Staggers, MD [facility], [facility] 04/07/23 2:39 PM

## 2023-04-08 LAB — COMPREHENSIVE METABOLIC PANEL
ALT: 11 U/L (ref 0–53)
AST: 24 U/L (ref 0–37)
Albumin: 3.4 g/dL — ABNORMAL LOW (ref 3.5–5.2)
Alkaline Phosphatase: 69 U/L (ref 39–117)
BUN: 19 mg/dL (ref 6–23)
CO2: 34 meq/L — ABNORMAL HIGH (ref 19–32)
Calcium: 9.9 mg/dL (ref 8.4–10.5)
Chloride: 96 meq/L (ref 96–112)
Creatinine, Ser: 0.81 mg/dL (ref 0.40–1.50)
GFR: 82.11 mL/min (ref >60.00)
Glucose, Bld: 94 mg/dL (ref 70–99)
Potassium: 4.8 meq/L (ref 3.5–5.1)
Sodium: 137 meq/L (ref 135–145)
Total Bilirubin: 0.2 mg/dL (ref 0.2–1.2)
Total Protein: 9 g/dL — ABNORMAL HIGH (ref 6.0–8.3)

## 2023-04-08 LAB — HEMOGLOBIN A1C: Hgb A1c MFr Bld: 6.2 % (ref 4.6–6.5)

## 2023-04-15 ENCOUNTER — Telehealth: Payer: Self-pay

## 2023-04-15 NOTE — Telephone Encounter (Signed)
-----   Message from Shade Flood sent at 04/14/2023  5:23 PM EST ----- Results sent by MyChart, but appears patient has not yet reviewed those results.  Please call and make sure they have either seen note or discuss result note.  Thanks.

## 2023-04-22 ENCOUNTER — Ambulatory Visit: Payer: Medicare PPO | Admitting: Internal Medicine

## 2023-04-22 ENCOUNTER — Encounter: Payer: Self-pay | Admitting: Internal Medicine

## 2023-04-22 VITALS — BP 114/62 | HR 87 | Ht 70.0 in | Wt 123.8 lb

## 2023-04-22 DIAGNOSIS — J84112 Idiopathic pulmonary fibrosis: Secondary | ICD-10-CM | POA: Diagnosis not present

## 2023-04-22 DIAGNOSIS — R64 Cachexia: Secondary | ICD-10-CM

## 2023-04-22 DIAGNOSIS — R590 Localized enlarged lymph nodes: Secondary | ICD-10-CM

## 2023-04-22 NOTE — Progress Notes (Signed)
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 cancel if in research  -We will see how pirfenidone  uptake is going   FOLLOWUP Return for 30 min visit, with any of the APPS, Face to Face Visit.   ( Level 05 visit E&M 2024: Estb >= 40 min   in  visit type: on-site physical face to visit  in total care time and counseling or/and coordination of care by this undersigned MD - Dr Dorethia Cave. This includes one or more of the following on this same day 04/22/2023: pre-charting, chart review, note writing, documentation discussion of test results, diagnostic or treatment recommendations, prognosis, risks and benefits of management options, instructions, education, compliance or risk-factor reduction. It excludes time spent by the CMA or office  staff in the care of the patient. Actual time 42 min)    SIGNATURE    Dr. Dorethia Cave, M.D., F.C.C.P,  Pulmonary and Critical Care Medicine Staff Physician, [facility] Director - Interstitial Lung Disease  Program  Pulmonary Fibrosis [facility] [place], [place], [address]  Pager: [phone], If no answer or between  15:00h - 7:00h: call [phone] Telephone: [phone]  10:13 AM 04/22/2023

## 2023-04-22 NOTE — Patient Instructions (Addendum)
 ICD-10-CM   1. IPF (idiopathic pulmonary fibrosis) (HCC)  J84.112     2. Barrier to communication  Z78.9     3. Mediastinal adenopathy  R59.0     4. Cachexia (HCC)  R64         #ILD/IPF    - giving you diagnosis of IPF 01/14/2023 - based on age > 3, male, UIP pattern , prpgression, clubbing and negative sreology - technical issues with PFT cooperation 01/14/2023 -Pirfenidone  not started  - in Oct/Nov 2024: because our investigations revealed that you have not picked up the phone when the pharmacy called you and the voicemail box is full - then 02/27/23 we gave you phone number [phone] [facility] to followup   PLAN - - given continued weight loss + challenges getting pirfenidone  started  - consider research as care option with [facility] protocol - take consent copy and TAmmy Tanda will contact you next week  - Call [facility]  [phone] If you are interested in pirfenidone  low-dose protocol  -1 pill 3 times daily with food for 2 weeks and then go to 2 pills 3 times daily  -Your son will be the primary contact monitoring and helping you take your medications   - CMA to change primary contact number to be son's cell pone   #Mediasinal adenopathy  > 2cm -reassuring PET scan in September 2024 and stability over time per radiology  Plan  -Expectant follow-up  #Cachexia and weight loss  - continued weihgt loass and I am really worried pirfenidone  or ofev can make this worse if you start them   Plan - Monitor - work wth [facility] Levora, Reyes SAUNDERS, MD  on this  Followup  - 6 weeks with  nurse practitioner 30 min vsiit    - can cancel if in research  -We will see how pirfenidone  uptake is going

## 2023-05-16 ENCOUNTER — Ambulatory Visit (HOSPITAL_COMMUNITY): Payer: Medicare PPO | Attending: Cardiology

## 2023-05-16 DIAGNOSIS — I350 Nonrheumatic aortic (valve) stenosis: Secondary | ICD-10-CM | POA: Diagnosis not present

## 2023-05-16 LAB — ECHOCARDIOGRAM COMPLETE
AR max vel: 0.69 cm2
AV Area VTI: 0.78 cm2
AV Area mean vel: 0.66 cm2
AV Mean grad: 33.4 mm[Hg]
AV Peak grad: 53.8 mm[Hg]
Ao pk vel: 3.67 m/s
Area-P 1/2: 4.26 cm2
P 1/2 time: 328 ms
S' Lateral: 2.8 cm

## 2023-05-18 ENCOUNTER — Encounter: Payer: Self-pay | Admitting: Cardiovascular Disease

## 2023-07-02 ENCOUNTER — Telehealth: Payer: Self-pay | Admitting: Family Medicine

## 2023-07-02 NOTE — Telephone Encounter (Signed)
 Copied from CRM [id number]. Topic: Clinical - Medical Advice >> Jul 02, 2023 11:21 AM Lennart Pall wrote:   Reason for CRM: Patients son calling to ask if there is any way that a note can be made so he can give to his work, that he is taking care of his father as he has Pulmonary fibrosis and needs help. Sons name is Skylar [phone]

## 2023-07-02 NOTE — Telephone Encounter (Signed)
 Agree, he should discuss with HR to see what they have available.  Typically they initiate FMLA paperwork, or ask [place] for any specific accommodations or letters if needed.  I am happy to complete a letter if that will help, let me know.  If he can provide any specifics regarding the amount of time needed and how often he needs to help care for his father, I can provide that on a letter.  Thanks

## 2023-07-02 NOTE — Telephone Encounter (Signed)
 Called patient's son to discuss FMLA or to talk with HR to see other options. Patients son did verbalize this is a new job and he does not qualify for FMLA and he was calling in to see if we could give any recommendations to help. Verbalized there may not be more that we can do without getting paper work from HR but I would send this over to see if there was something different that could be done that me and the patient are not aware of. Please advise.

## 2023-07-03 NOTE — Telephone Encounter (Signed)
 Called patient to relay recommendations from Dr.Greene. Patient states his supervisor is going to try and help get a leave of absence paper work. Dr.Greene did discuss with patient over the phone as well.

## 2023-07-08 DEATH — deceased

## 2023-07-11 NOTE — Telephone Encounter (Signed)
 Copied from CRM [id number]. Topic: General - Deceased Patient >> 07/21/2023  1:20 PM Denese Killings wrote:  Name of caller: Crystall  Date of death: 07/16/23   Name of funeral home: [employer]  Phone number of funeral home: [phone]  Provider that needs to sign form: Death Certificate  Timeline for signing: n/a

## 2023-07-11 NOTE — Telephone Encounter (Signed)
 Called patient's son Savior.  Expressed condolences on the passing of his father.  Let him know that I was honored to be his primary care provider, very special patient and he will be missed.  Service was yesterday, and plans to bring me a copy of the program.  Let him know I am honored by this act and appreciate him thinking of me.  Will complete death certificate.  His son did note that he passed around 8:00 on Sunday morning.  He had checked on him, was awake that morning, and then when he came back to check on him was not breathing.  Not responsive.  Had been more fatigued few days prior, more short of breath with activity.  More somnolent that weekend.  Cause of death appears to be his interstitial lung disease, idiopathic pulmonary fibrosis.  Also with severe aortic stenosis, but suspect the interstitial lung disease was primary cause.  Will complete death certificate.

## 2023-08-01 NOTE — Telephone Encounter (Signed)
 Chronic condition form has been discarded since patient hasn't picked it from our office.

## 2023-10-08 ENCOUNTER — Ambulatory Visit: Payer: Medicare Other | Admitting: Family Medicine
